# Patient Record
Sex: Male | Born: 1965
Health system: Southern US, Community
[De-identification: ages and names within clinical notes are randomized; demographics above are authoritative.]

---

## 2003-11-02 ENCOUNTER — Inpatient Hospital Stay (HOSPITAL_COMMUNITY): Admission: AD | Admit: 2003-11-02 | Discharge: 2003-11-05 | Payer: Self-pay | Admitting: Family Medicine

## 2003-11-05 ENCOUNTER — Encounter: Admission: RE | Admit: 2003-11-05 | Discharge: 2003-11-05 | Payer: Self-pay | Admitting: Family Medicine

## 2008-09-24 ENCOUNTER — Emergency Department (HOSPITAL_COMMUNITY): Admission: EM | Admit: 2008-09-24 | Discharge: 2008-09-24 | Payer: Self-pay | Admitting: Emergency Medicine

## 2010-02-08 IMAGING — CT CT HEAD W/O CM
4 of 6 series · 16 of 30 positions shown, 17 images · non-contrast
Comparison: None.

CT HEAD

CLINICAL DATA: Fall.  Neck pain.

CT HEAD WITHOUT CONTRAST
CT CERVICAL SPINE WITHOUT CONTRAST
TECHNIQUE: Multidetector CT imaging of the head and cervical spine
was performed following the standard protocol without intravenous
contrast.  Multiplanar CT image reconstructions of the cervical
spine were also generated.

[Series 2: brain · axial · 0.47mm/px · z∈[+382,+434]mm · 2 of 32 slices shown]
[im 11/32  brain]
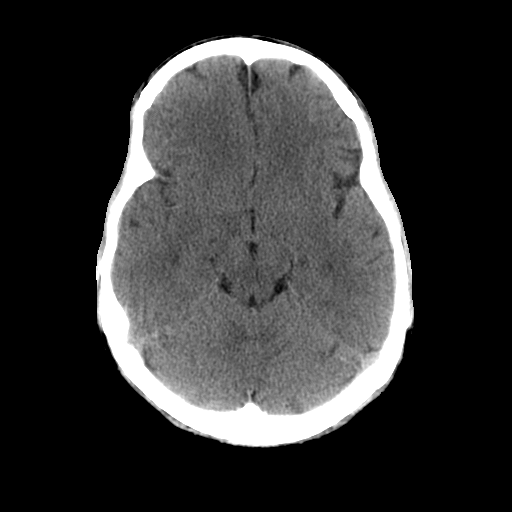
[im 21/32  brain]
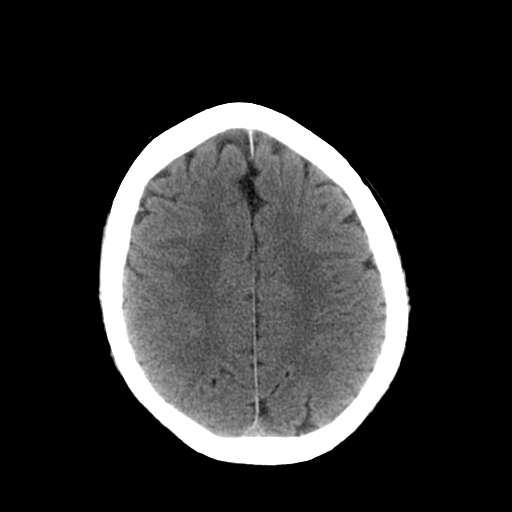

[Series 3: recon 2: brain · axial · 0.47mm/px · z∈[+359,+459]mm · 4 of 64 slices shown, 5 images]
[im 13/64  brain]
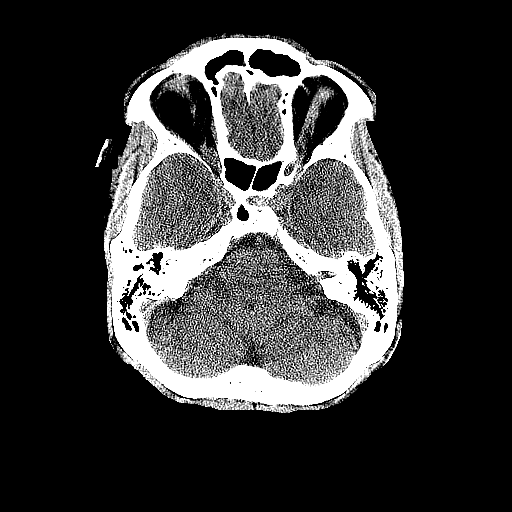
[im 13/64  bone]
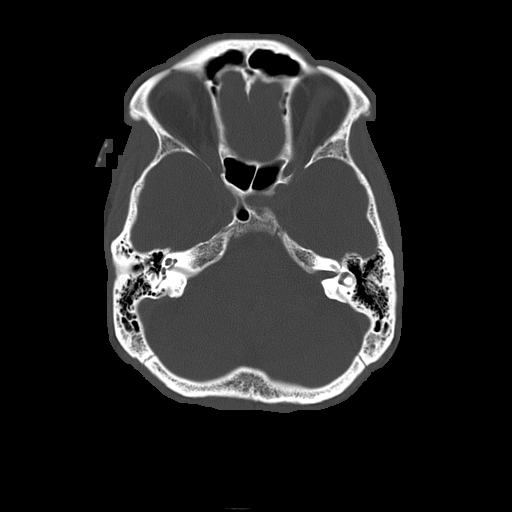
[im 26/64  brain]
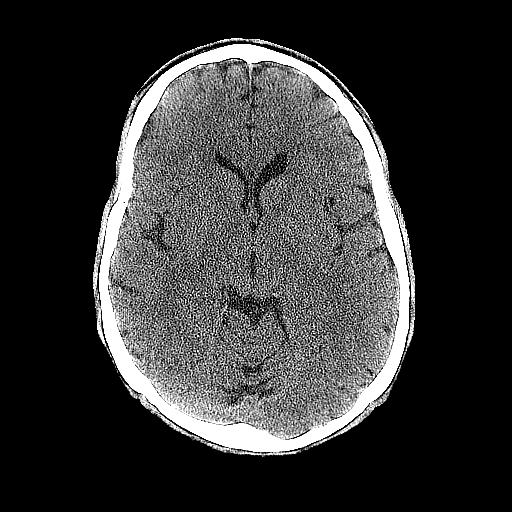
[im 38/64  brain]
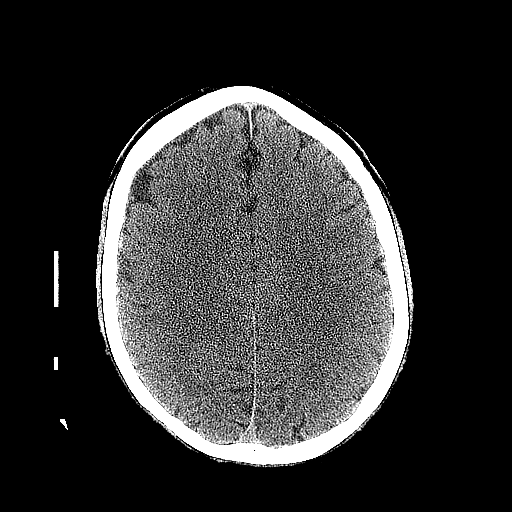
[im 51/64  brain]
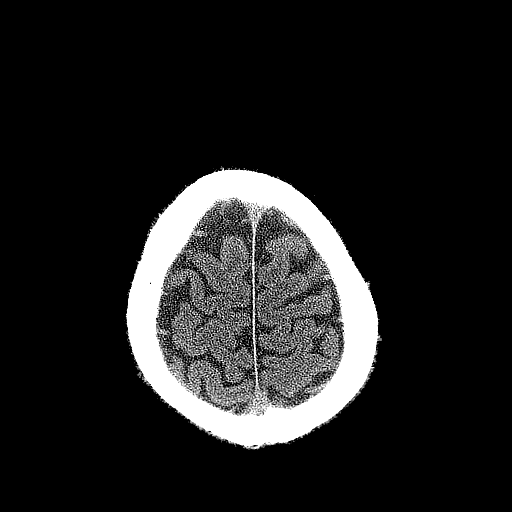

[Series 4: cervical spine · axial · 0.27mm/px · z∈[+146,+278]mm · 5 of 81 slices shown]
[im 14/81  brain]
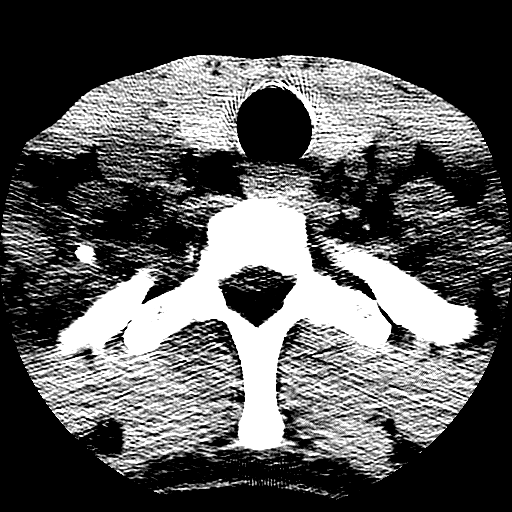
[im 27/81  brain]
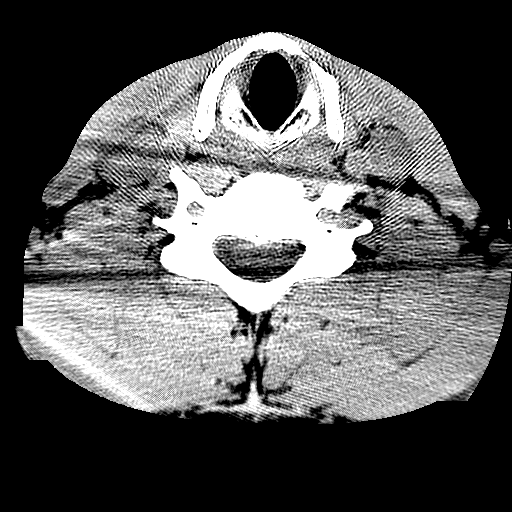
[im 41/81  brain]
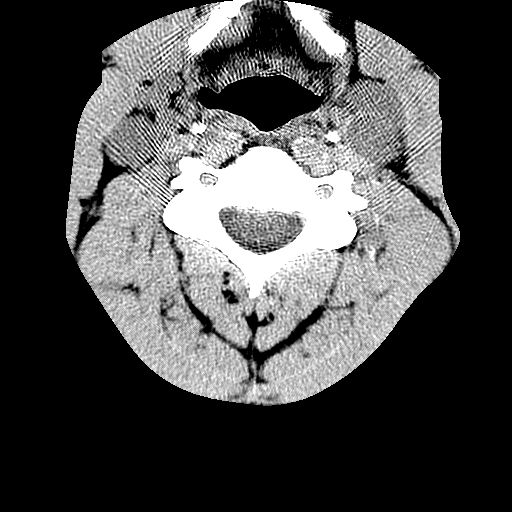
[im 54/81  brain]
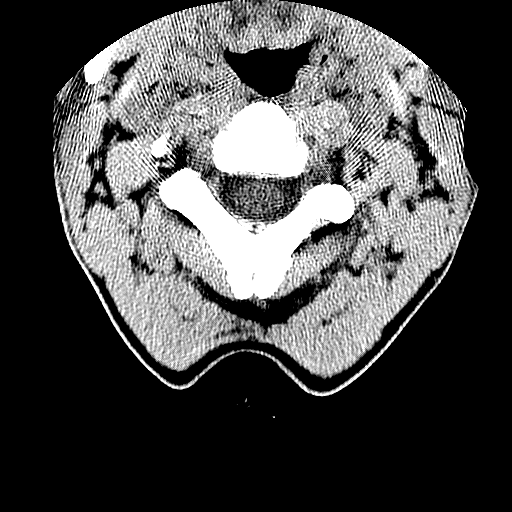
[im 67/81  brain]
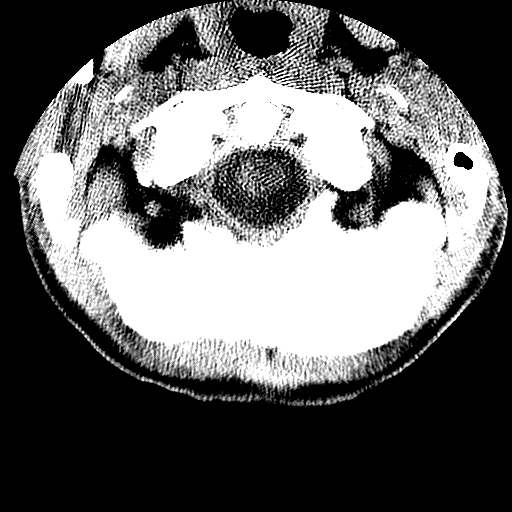

[Series 5: recon 2: cervical spine · axial · 0.27mm/px · z∈[+146,+278]mm · 5 of 81 slices shown]
[im 14/81  brain]
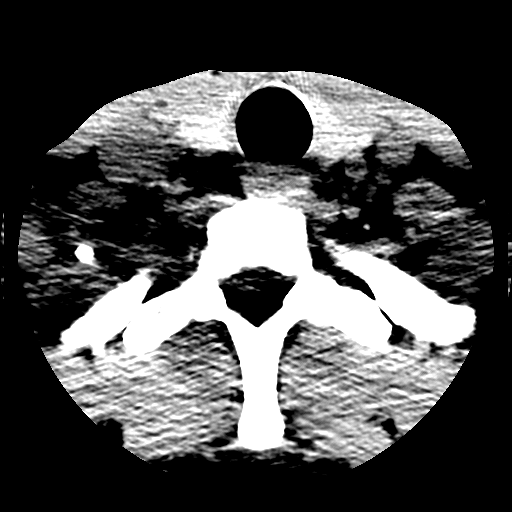
[im 27/81  brain]
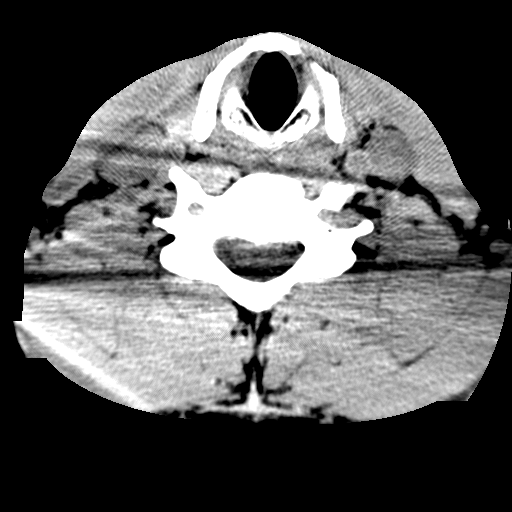
[im 41/81  brain]
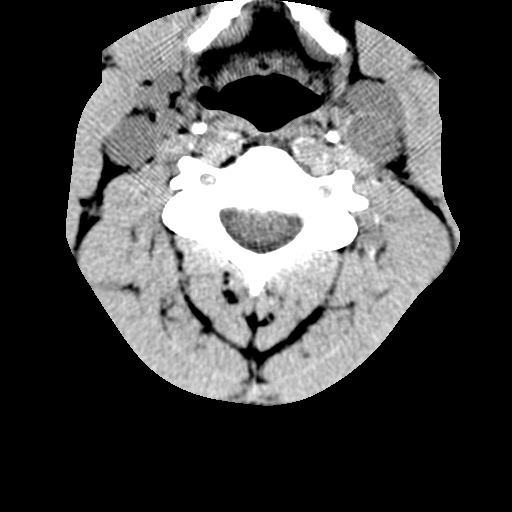
[im 54/81  brain]
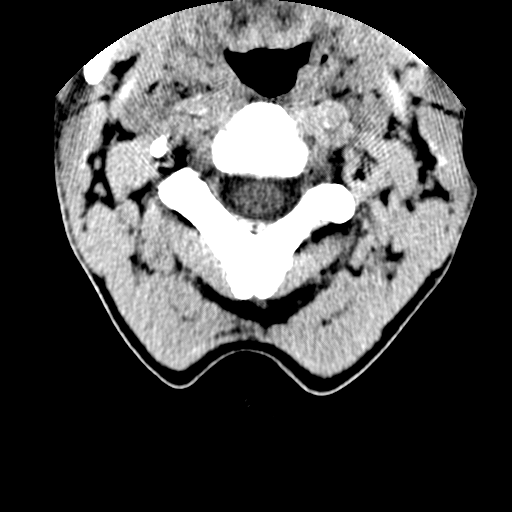
[im 67/81  brain]
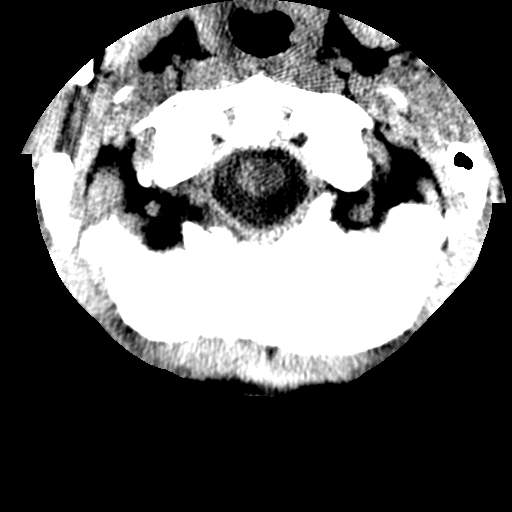

[16 of 30 positions shown; findings below may reference images not displayed]

FINDINGS: Ventricles are normal.  There is no intracranial
hemorrhage.  There is no infarct or mass lesion.  Negative for
skull fracture.
IMPRESSION: Negative.

CT CERVICAL SPINE
FINDINGS: Normal cervical alignment.  There is straightening of the
cervical lordosis.  There is mild disc degeneration and spurring at
C5-6 and C6-7.

Negative for fracture.
IMPRESSION: Negative for cervical spine fracture.

## 2010-10-08 LAB — POCT CARDIAC MARKERS
CKMB, poc: <1 ng/mL — ABNORMAL LOW (ref 1.0–8.0)
Myoglobin, poc: 23 ng/mL (ref 12–200)
Troponin i, poc: <0.05 ng/mL (ref 0.00–0.09)

## 2010-10-08 LAB — DIFFERENTIAL
Eosinophils Absolute: 0.3 10*3/uL (ref 0.0–0.7)
Eosinophils Relative: 4 % (ref 0–5)
Lymphocytes Relative: 15 % (ref 12–46)
Lymphs Abs: 1.2 10*3/uL (ref 0.7–4.0)
Monocytes Absolute: 0.5 10*3/uL (ref 0.1–1.0)
Monocytes Relative: 7 % (ref 3–12)

## 2010-10-08 LAB — POCT I-STAT, CHEM 8
BUN: 10 mg/dL (ref 6–23)
Chloride: 107 mEq/L (ref 96–112)
Glucose, Bld: 90 mg/dL (ref 70–99)
Potassium: 4.2 mEq/L (ref 3.5–5.1)
Sodium: 139 mEq/L (ref 135–145)

## 2010-10-08 LAB — CBC
Hemoglobin: 14.5 g/dL (ref 13.0–17.0)
MCHC: 34 g/dL (ref 30.0–36.0)
RBC: 4.9 MIL/uL (ref 4.22–5.81)
RDW: 14.4 % (ref 11.5–15.5)

## 2010-10-08 LAB — GLUCOSE, CAPILLARY: Glucose-Capillary: 84 mg/dL (ref 70–99)

## 2010-11-13 NOTE — H&P (Signed)
Dustin Lewis, Dustin Lewis NO.:  [id number]   MEDICAL RECORD NO.:  [id number]                   PATIENT TYPE:  INP   LOCATION:  5708                                 FACILITY:  [facility]   PHYSICIAN:  Franklyn Lor, MD                      DATE OF BIRTH:  03-07-1966   DATE OF ADMISSION:  11/02/2003  DATE OF DISCHARGE:                                HISTORY & PHYSICAL   ATTENDING PHYSICIAN:  Dr. Asencion Partridge of the family practice teaching  service.   CHIEF COMPLAINT:  Left leg cellulitis.   HISTORY OF PRESENT ILLNESS:  A 45 year old African American male of [facility] sent for direct admission by Dr. Cleta Alberts. The patient presented  to the [facility] the day prior to admission complaining of a one  to two day history of left lower extremity pain and swelling associated with  some redness. He had a fever at that time, white count of approximately 16.  He was diagnosed with cellulitis and given a shot of IM Rocephin and sent  home with p.o. Keflex. He returned to the [facility] today  feeling worse despite his fever having defervesced and his white count  having decreased to 13.7. The patient's compliance was an issue, according  to Dr. Cleta Alberts, and therefore he felt admission for IV antibiotics would be  appropriate.  The patient denies nausea, vomiting, or diarrhea, but admits  to some fever and chills on the days prior to admission. No known trauma or  animal or insect bites to the infected area.   REVIEW OF SYSTEMS:  The patient admits to unprotected sex.  Denies dysuria.  The patient admits to an episode of dizziness the day prior to admission in  which he was seated and began to feel light-headed, stood to walk across the  room, and the next thing I knew I was on the floor trying to get up.  Observers say he did not strike his head and that was unconscious for  approximately five seconds.  He reports that he has had several  episodes  like this over the years.   PAST MEDICAL HISTORY:  1. Chicken pox approximately two years ago requiring ICU stay.  2. Left mandible reconstruction with hardware some years ago following     facial trauma.   FAMILY HISTORY:  Father deceased, murdered; brother deceased, murdered;  mother deceased, unknown cause--patient states she has hypertension and was  on dialysis at the time of her passing.   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  Four to six beers per week. No IV drug use.   PHYSICAL EXAMINATION:  VITAL SIGNS: Temperature 98.4, pulse 96, respirations  137/81.  GENERAL: This is a thin, well-nourished [place] American male  in no apparent  distress. He is alert and oriented times three, interactive.  HEENT: Positive scleral icterus bilaterally which is the chronic and/or  baseline for this patient. PERRL; EOMI; MMM; oropharynx clear; nares clear;  no thyromegaly; no lymphadenopathy; no JVD; no carotid bruits.  CHEST: Regular rate and rhythm with no murmurs, rubs, or gallops. Lungs  clear to auscultation bilaterally with good respiratory effort. No CVA  tenderness.  ABDOMEN: Soft, nontender, nondistended; positive bowel sounds; no  hepatosplenomegaly; one soft nontender 1-cm mobile lymph node in the left  inguinal chain.  EXTREMITIES: 2+ radial and pedal pulses. Positive tenderness to palpation in  the left femoral vein. No palpable cords. Positive swelling of the left  lower extremity from the mid tibia distally, with patchy angular blanching  erythematous lesions superimposed on the swelling over the mid tibia. This  area is tender to palpation and warm to touch, but she has no full legs.  MUSCULOSKELETAL: The patient has 5/5 strength throughout, full range of  motion of his left lower extremity at all major joints. He does have an  antalgic gait secondary  to his pain in his left lower extremity.   LABORATORY DATA:  From an outside office white count 13.7 with an absolute  neutrophil count of 7.4, hemoglobin 13.5, platelet count 236,000.   ASSESSMENT/PLAN:  This is a 45 year old African American male with  cellulitis.  1. Cellulitis, largely clinical diagnosis and most likely in this situation     given the history and physical. Most likely the causative organisms.  The     patient is not immunocompromised that we know of, but the patient with a     history of pox as an adult requiring ICU stay, will off HIV test. IV     Ancef will cover the common causative of organisms including staph and     most likely strep. Differential diagnosis includes thrombophlebitis, but     no obvious streaking and no palpable cord on exam.  Plan is to mark the     current margin and follow clinically.  2. Pain. The patient is not in a great deal of pain at the time of exam, but     will provide Vicodin p.r.n..  3. Abnormal urinalysis. The day prior to admission the patient had urine     output greater than 100 of protein, moderate bilirubin at [facility].     Will check complete metabolic panel, repeat UA, and check urine drug     screen.  4. Syncope. The patient's episodes are sound vasovagal. He is on no     medications.  He is not volume depleted that we know of, but will give     him a 500 cc bolus of normal saline and check orthostatics and CBC. Also     check EKG to rule out occult cardiac source of syncope.                                                Franklyn Lor, MD    TD/MEDQ  D:  11/02/2003  T:  11/04/2003  Job:  [id number]

## 2010-11-13 NOTE — Discharge Summary (Signed)
Dustin Lewis, PUTT NO.:  [id number]   MEDICAL RECORD NO.:  [id number]                   PATIENT TYPE:  INP   LOCATION:  5708                                 FACILITY:  [facility]   PHYSICIAN:  Asencion Partridge, M.D.                  DATE OF BIRTH:  [birth year]   DATE OF ADMISSION:  11/02/2003  DATE OF DISCHARGE:  11/05/2003                                 DISCHARGE SUMMARY   DISCHARGE DIAGNOSES:  Cellulitis, left lower leg.   DISCHARGE MEDICATIONS:  1. Keflex 500 mg q.6h. x10 days.  2. Vicodin 500/5, 1-2 p.o. q.4-6h. p.r.n. pain.   HISTORY OF PRESENTING ILLNESS:  This is a 45 year old African-American male  patient of [facility] [place] who was admitted for cellulitis of  the left lower extremity that was recalcitrant to p.o. antibiotics as an  outpatient.  He was seen 2 days prior to admission, at which time he had a  fever and a white count of 16, diagnosed with cellulitis, and given a shot  of IM Rocephin.  He was sent home on p.o. Keflex.  Returned to [facility] the next day feeling worse, despite the fact that his fever had  defervesced, and his white count had decreased to 13.7.  According to Dr.  Cleta Alberts at [facility], the patient's compliance was a large issue, and, as  such, he was sent for inpatient IV antibiotics.  He denied nausea, vomiting,  or diarrhea at that time, but did have fever and chills.  He also had a  syncopal episode the day prior to admission that was associated with  shortness of air.   ADMISSION PHYSICAL AND LABORATORY DATA:  VITAL SIGNS:  Temperature 98.4,  pulse 96, respirations 20, blood pressure 137/81.  GENERAL:  This is a well-nourished African-American male in no apparent  distress, alert and oriented x3.  HEENT:  Positive scleral icterus bilaterally, which appears to be chronic.  HEART:  Regular rate and rhythm with no murmurs, rubs, or gallops.  LUNGS:  Clear.  BACK:  No CVA tenderness.  ABDOMEN:   Benign.  The patient did have an approximately 1 cm mobile,  nontender lymph node in the left inguinal chain.  EXTREMITIES:  The patient had positive tenderness to palpation in the left  femoral vein.  Also had positive swelling of the left lower leg from the mid-  tibial distally, with patchy irregular erythematous lesions that blanched,  superimposed on the swelling.  Warm to touch in this area.  The patient did  have 2+ pulses distally, and 5/5 strength throughout.  He did demonstrate an  antalgic gait secondary to pain in his left lower extremity.   Lab data at that time from the outside office showed a white count of 13.7,  with an absolute neutrophil count of 7.4, hemoglobin 13.5, platelet count  236,000.   The patient was admitted  to the family practice teaching service, and  started on IV Ancef q.8h., and given Vicodin p.o. q.4h. p.r.n. for pain.  The patient's erythema gradually improved during his stay here.  On hospital  day #2, the patient continued to complain of severe left lower extremity  pain, and at that time gave additional history stating that over the last 3  months he had had intermittent swelling, pain, and erythema of that same  extremity.  Combined with a history of syncopal episode associated with  shortness of breath the day prior to this admission, there was high  suspicion for deep vein thrombosis and possible pulmonary embolism.  To that  end, the patient had lower extremity venous Dopplers which showed no  evidence of acute DVT, superficial thrombosis, or Baker's cyst in either  leg.  It did show in the left leg vascularized lymph node in the inguinal  region, and an irregular anechoic structure at the lateral distal calf  measuring approximately 0.6 x 0.3 x 1 cm.  This area corresponded clinically  to the area of most acute tenderness.  In the right lower extremity, the  patient's Doppler was significant for thickened walls noted in the great  saphenous  vein, suggesting chronic clot, and several lymph nodes, the  largest of which was 2 cm x 1 cm.  I discussed at length these findings with  both the ultrasound technician and the radiologist, and the decision was  made to pursue an MRI to further address this anechoic irregularity in the  left later calf.  The patient was agreeable to this treatment plan, but,  because of the radiology department being very busy, his MRI was pushed back  to hospital day #3 early a.m.  On hospital day #3, morning rounds, the  patient expressed his dissatisfaction with having to stay, and was insistent  on leaving.  As such, results of the MRI were pending at the time of this  dictation summary and at the time of the patient's discharge.  Clinically,  his erythema and swelling had almost completely resolved, but he had  persistent exquisite tenderness in the left lateral calf in the area of this  supposed irregularity.  The patient stated that he would like [place] to call him  if the results were at all abnormal.  He was scheduled for an appointment  with Dr. Cleta Alberts a week from today, on Nov 12, 2003, to ensure resolution of  this left lower extremity pain.  He was sent home with p.o. Keflex and  Vicodin, as stated above in discharge medications.   ADDITIONAL STUDIES:  At the time of admission, the patient's medical history  was poorly defined, based on his reluctance to enter the medical system.  He  did admit to an ICU stay secondary to chicken pox approximately 2 years  prior to the his admission.  Because of chicken pox being unusual in an  adult, and the patient having no obvious source for cellulitis of an  extremity, we were concerned for some form of immunosuppression.  To that  end, the patient consented for HIV antibody test, which was nonreactive.  His ESR was 20, which was within normal limits.  He did have eosinophilia on his complete blood count on hospital day #1; this resolved on hospital day  #2.   The patient also had had a hepatitis panel drawn.  Hepatitis B surface  antigen was negative.  Hepatitis B surface antibody was negative.  Hepatitis  C results  were pending at the time of discharge.   ADDITIONAL LABORATORY DATA:  Sodium 140, potassium 3.8, chloride 107, bicarb  28, BUN 4, creatinine 0.7, glucose 97, total bilirubin 1.1, with an indirect  of 0.2, and direct of 0.9, alkaline phosphatase of 284, AST 47, ALT 51,  total protein 6.2, albumin 3.  PT 12.7, INR 0.9, PTT 45.  Urinalysis was  negative.   Increased LFT's could be attributed to the patient's alcohol ingestion  immediately prior to admission.  Otherwise, it may be worth following with a  repeat complete metabolic panel at his follow up visit.   With regard to his syncopal episode prior to admission, based on history, it  sounded consistent with orthostasis.  Orthostatic blood pressures the  morning following admission were 114/160 with a heart rate of 87 while lying  down, 132/72 with a heart rate of 89 while sitting, and 122/73 with a heart  rate of 89 while standing.   DISPOSITION:  In the end, we were unable to attribute the patient's  cellulitis to any known etiology.  He did appear to respond to the  antibiotics quite well, and hopefully his situation will resolve with the  continued use of p.o.  antibiotics as an outpatient.  In addition, we were unable to come up with  any known risk factors for potential immunosuppression.  According to Dr.  Cleta Alberts, this patient has a history of poor compliance, and, as such, we may  lose him to follow up.      Franklyn Lor, MD                            Asencion Partridge, M.D.    TD/MEDQ  D:  11/05/2003  T:  11/06/2003  Job:  [id number]   cc:   Brett Canales A. Cleta Alberts, M.D.  [address]  [place]  [place] [address]  Fax: [phone]

## 2011-04-07 ENCOUNTER — Emergency Department (HOSPITAL_COMMUNITY)
Admission: EM | Admit: 2011-04-07 | Discharge: 2011-04-07 | Disposition: A | Discharge status: Home / Self Care | Payer: Self-pay | Attending: Emergency Medicine | Admitting: Emergency Medicine

## 2011-04-07 DIAGNOSIS — K047 Periapical abscess without sinus: Secondary | ICD-10-CM | POA: Insufficient documentation

## 2011-04-07 DIAGNOSIS — K029 Dental caries, unspecified: Secondary | ICD-10-CM | POA: Insufficient documentation

## 2013-09-03 ENCOUNTER — Encounter (HOSPITAL_COMMUNITY): Payer: Self-pay | Admitting: Emergency Medicine

## 2013-09-03 ENCOUNTER — Emergency Department (HOSPITAL_COMMUNITY)
Admission: EM | Admit: 2013-09-03 | Discharge: 2013-09-03 | Disposition: A | Discharge status: Home / Self Care | Payer: 59 | Attending: Emergency Medicine | Admitting: Emergency Medicine

## 2013-09-03 ENCOUNTER — Emergency Department (HOSPITAL_COMMUNITY): Payer: 59

## 2013-09-03 DIAGNOSIS — F17200 Nicotine dependence, unspecified, uncomplicated: Secondary | ICD-10-CM | POA: Insufficient documentation

## 2013-09-03 DIAGNOSIS — N453 Epididymo-orchitis: Secondary | ICD-10-CM | POA: Insufficient documentation

## 2013-09-03 DIAGNOSIS — N451 Epididymitis: Secondary | ICD-10-CM

## 2013-09-03 LAB — CBC WITH DIFFERENTIAL/PLATELET
BASOS PCT: 0 % (ref 0–1)
Basophils Absolute: 0 10*3/uL (ref 0.0–0.1)
Eosinophils Absolute: 0.2 10*3/uL (ref 0.0–0.7)
Eosinophils Relative: 1 % (ref 0–5)
HEMATOCRIT: 40.5 % (ref 39.0–52.0)
HEMOGLOBIN: 14.2 g/dL (ref 13.0–17.0)
Lymphocytes Relative: 9 % — ABNORMAL LOW (ref 12–46)
Lymphs Abs: 1.9 10*3/uL (ref 0.7–4.0)
MCH: 29.5 pg (ref 26.0–34.0)
MCHC: 35.1 g/dL (ref 30.0–36.0)
MCV: 84.2 fL (ref 78.0–100.0)
Monocytes Absolute: 1.2 10*3/uL — ABNORMAL HIGH (ref 0.1–1.0)
Monocytes Relative: 6 % (ref 3–12)
NEUTROS PCT: 84 % — AB (ref 43–77)
Neutro Abs: 17 10*3/uL — ABNORMAL HIGH (ref 1.7–7.7)
Platelets: 219 10*3/uL (ref 150–400)
RBC: 4.81 MIL/uL (ref 4.22–5.81)
RDW: 14 % (ref 11.5–15.5)
WBC: 20.3 10*3/uL — AB (ref 4.0–10.5)

## 2013-09-03 LAB — URINALYSIS, ROUTINE W REFLEX MICROSCOPIC
Glucose, UA: NEGATIVE mg/dL
Hgb urine dipstick: NEGATIVE
KETONES UR: NEGATIVE mg/dL
Nitrite: NEGATIVE
PH: 5.5 (ref 5.0–8.0)
Protein, ur: NEGATIVE mg/dL
SPECIFIC GRAVITY, URINE: 1.027 (ref 1.005–1.030)
Urobilinogen, UA: 0.2 mg/dL (ref 0.0–1.0)

## 2013-09-03 LAB — BASIC METABOLIC PANEL
BUN: 9 mg/dL (ref 6–23)
CHLORIDE: 102 meq/L (ref 96–112)
CO2: 22 meq/L (ref 19–32)
Calcium: 9 mg/dL (ref 8.4–10.5)
Creatinine, Ser: 0.72 mg/dL (ref 0.50–1.35)
GFR calc non Af Amer: >90 mL/min (ref >90)
Glucose, Bld: 99 mg/dL (ref 70–99)
POTASSIUM: 3.7 meq/L (ref 3.7–5.3)
Sodium: 139 mEq/L (ref 137–147)

## 2013-09-03 LAB — URINE MICROSCOPIC-ADD ON

## 2013-09-03 MED ORDER — NAPROXEN 500 MG PO TABS: 500.0000 mg | ORAL_TABLET | Freq: Two times a day (BID) | ORAL | Status: DC

## 2013-09-03 MED ORDER — SODIUM CHLORIDE 0.9 % IV SOLN
1000.0000 mL | Freq: Once | INTRAVENOUS | Status: AC
  Administered 2013-09-03: 1000 mL via INTRAVENOUS

## 2013-09-03 MED ORDER — DOXYCYCLINE HYCLATE 100 MG PO TABS
100.0000 mg | ORAL_TABLET | Freq: Once | ORAL | Status: AC
  Administered 2013-09-03: 100 mg via ORAL
  Filled 2013-09-03: qty 1

## 2013-09-03 MED ORDER — ONDANSETRON HCL 4 MG/2ML IJ SOLN
4.0000 mg | Freq: Once | INTRAMUSCULAR | Status: AC
  Administered 2013-09-03: 4 mg via INTRAVENOUS
  Filled 2013-09-03: qty 2

## 2013-09-03 MED ORDER — HYDROMORPHONE HCL PF 1 MG/ML IJ SOLN
1.0000 mg | INTRAMUSCULAR | Status: DC | PRN
  Administered 2013-09-03: 1 mg via INTRAVENOUS
  Filled 2013-09-03: qty 1

## 2013-09-03 MED ORDER — HYDROCODONE-ACETAMINOPHEN 5-325 MG PO TABS: 1.0000 | ORAL_TABLET | ORAL | Status: DC | PRN

## 2013-09-03 MED ORDER — SODIUM CHLORIDE 0.9 % IV SOLN
1000.0000 mL | INTRAVENOUS | Status: DC
  Administered 2013-09-03: 1000 mL via INTRAVENOUS

## 2013-09-03 MED ORDER — DOXYCYCLINE HYCLATE 100 MG PO CAPS: 100.0000 mg | ORAL_CAPSULE | Freq: Two times a day (BID) | ORAL | Status: DC

## 2013-09-03 MED ORDER — DEXTROSE 5 % IV SOLN
1.0000 g | INTRAVENOUS | Status: DC
  Administered 2013-09-03: 1 g via INTRAVENOUS
  Filled 2013-09-03: qty 10

## 2013-09-03 NOTE — ED Notes (Signed)
Pt sts right sided testicle pain x 2 days; pt sts pain into abd; pt denies burning with urination

## 2013-09-03 NOTE — Discharge Instructions (Signed)
Orchitis °Orchitis is an infection of the testicle of usually sudden onset (happens quickly). It may be viral or bacterial (caused by germs). Usually with this illness there is generalized malaise (not feeling well) and fever. There is also pain. There is usually tenderness and swelling of the scrotum and testicle. °DIAGNOSIS  °Your caregiver will perform an exam to make sure there is not another reason for the pain in your testicle. A rectal exam may be done to find out if the prostate is swollen and tender. Blood work may be done to see if your white blood cell count is elevated. This can help determine if an infection is viral or bacterial. A urinalysis can also determine what type of infection is present. Most bacterial infections can be treated with antibiotics (medications which kill germs). °LET YOUR CAREGIVER KNOW ABOUT: °· Allergies. °· Medications taken including herbs, eye drops, over the counter medications, and creams. °· Use of steroids (by mouth or creams). °· Previous problems with anesthetics or novocaine. °· Previous prostate infections. °· History of blood clots (thrombophlebitis). °· History of bleeding or blood problems. °· Previous surgery. °· Previous urinary tract infection. °· Other health problems. °HOME CARE INSTRUCTIONS  °· Apply cold packs to the scrotal area for twenty minutes, four times per day or as needed. °· A scrotal support may be helpful. Keep a small pillow or support under your testicles while lying or sitting down. °· Only take over-the-counter or prescription medicines for pain, discomfort, or fever as directed by your caregiver. °· Take all medications, including antibiotics, as directed. Take the antibiotics for the full prescribed length of time even if you are feeling better. °SEEK IMMEDIATE MEDICAL CARE IF:  °· Your redness, swelling, or pain in the testicle increases or is not getting better. °· You have a fever. °· You have pain not relieved with medicines. °· You  have any worsening of any symptoms (problems) that originally brought you in for medical care. °Document Released: 06/11/2000 Document Revised: 09/06/2011 Document Reviewed: 06/14/2005 °ExitCare® Patient Information ©2014 [employer]. ° °Epididymitis °Epididymitis is a swelling (inflammation) of the epididymis. The epididymis is a cord-like structure along the back part of the testicle. Epididymitis is usually, but not always, caused by infection. This is usually a sudden problem beginning with chills, fever and pain behind the scrotum and in the testicle. There may be swelling and redness of the testicle. °DIAGNOSIS  °Physical examination will reveal a tender, swollen epididymis. Sometimes, cultures are obtained from the urine or from prostate secretions to help find out if there is an infection or if the cause is a different problem. Sometimes, blood work is performed to see if your white blood cell count is elevated and if a germ (bacterial) or viral infection is present. Using this knowledge, an appropriate medicine which kills germs (antibiotic) can be chosen by your caregiver. A viral infection causing epididymitis will most often go away (resolve) without treatment. °HOME CARE INSTRUCTIONS  °· Hot sitz baths for 20 minutes, 4 times per day, may help relieve pain. °· Only take over-the-counter or prescription medicines for pain, discomfort or fever as directed by your caregiver. °· Take all medicines, including antibiotics, as directed. Take the antibiotics for the full prescribed length of time even if you are feeling better. °· It is very important to keep all follow-up appointments. °SEEK IMMEDIATE MEDICAL CARE IF:  °· You have a fever. °· You have pain not relieved with medicines. °· You have any worsening of   your problems. °· Your pain seems to come and go. °· You develop pain, redness, and swelling in the scrotum and surrounding areas. °MAKE SURE YOU:  °· Understand these instructions. °· Will watch  your condition. °· Will get help right away if you are not doing well or get worse. °Document Released: 06/11/2000 Document Revised: 09/06/2011 Document Reviewed: 05/01/2009 °ExitCare® Patient Information ©2014 [employer]. ° °

## 2013-09-03 NOTE — ED Notes (Signed)
Dr.Knapp at bedside  

## 2013-09-03 NOTE — ED Provider Notes (Signed)
CSN: [id number]     Arrival date & time 09/03/13  1036 History   First MD Initiated Contact with Patient 09/03/13 1124     Chief Complaint  Patient presents with  . Testicle Pain    Patient is a 48 y.o. male presenting with testicular pain. The history is provided by the patient.  Testicle Pain The current episode started 2 days ago. The problem occurs constantly. The problem has been gradually worsening. Associated symptoms include abdominal pain. Exacerbated by: movement and palpation. The symptoms are relieved by NSAIDs. Treatments tried: naids. The treatment provided mild relief.  No trouble with urination.  He thinks he saw some blood in the urine.  He feels like his testicle on that right side is swelling.  No vomiting, diarrhea.  No fever but he has felt chilled.  History reviewed. No pertinent past medical history. History reviewed. No pertinent past surgical history. History reviewed. No pertinent family history. History  Substance Use Topics  . Smoking status: Current Every Day Smoker  . Smokeless tobacco: Not on file  . Alcohol Use: Yes    Review of Systems  Gastrointestinal: Positive for abdominal pain.  Genitourinary: Positive for testicular pain.  All other systems reviewed and are negative.      Allergies  Review of patient's allergies indicates no known allergies.  Home Medications   Current Outpatient Rx  Name  Route  Sig  Dispense  Refill  . Aspirin-Salicylamide-Caffeine (BC HEADACHE PO)   Oral   Take 2-3 packets by mouth daily as needed (for pain).         Marland Kitchen doxycycline (VIBRAMYCIN) 100 MG capsule   Oral   Take 1 capsule (100 mg total) by mouth 2 (two) times daily.   28 capsule   0   . HYDROcodone-acetaminophen (NORCO/VICODIN) 5-325 MG per tablet   Oral   Take 1-2 tablets by mouth every 4 (four) hours as needed.   30 tablet   0   . naproxen (NAPROSYN) 500 MG tablet   Oral   Take 1 tablet (500 mg total) by mouth 2 (two) times daily.   30  tablet   0    BP 111/72  Pulse 82  Temp(Src) 98.9 F (37.2 C) (Oral)  Resp 16  SpO2 100% Physical Exam  Nursing note and vitals reviewed. Constitutional: He appears well-developed and well-nourished. No distress.  HENT:  Head: Normocephalic and atraumatic.  Right Ear: External ear normal.  Left Ear: External ear normal.  Eyes: Conjunctivae are normal. Right eye exhibits no discharge. Left eye exhibits no discharge. No scleral icterus.  Neck: Neck supple. No tracheal deviation present.  Cardiovascular: Normal rate, regular rhythm and intact distal pulses.   Pulmonary/Chest: Effort normal and breath sounds normal. No stridor. No respiratory distress. He has no wheezes. He has no rales.  Abdominal: Soft. Bowel sounds are normal. He exhibits no distension. There is no tenderness. There is no rebound and no guarding. Hernia confirmed negative in the right inguinal area and confirmed negative in the left inguinal area.  Genitourinary:    Right testis shows swelling and tenderness. Left testis shows no mass, no swelling and no tenderness.  Musculoskeletal: He exhibits no edema and no tenderness.  Neurological: He is alert. He has normal strength. No cranial nerve deficit (no facial droop, extraocular movements intact, no slurred speech) or sensory deficit. He exhibits normal muscle tone. He displays no seizure activity. Coordination normal.  Skin: Skin is warm and dry. No rash noted.  Psychiatric: He has a normal mood and affect.    ED Course  Procedures (including critical care time) Labs Review Labs Reviewed  CBC WITH DIFFERENTIAL - Abnormal; Notable for the following:    WBC 20.3 (*)    Neutrophils Relative % 84 (*)    Neutro Abs 17.0 (*)    Lymphocytes Relative 9 (*)    Monocytes Absolute 1.2 (*)    All other components within normal limits  URINALYSIS, ROUTINE W REFLEX MICROSCOPIC - Abnormal; Notable for the following:    Bilirubin Urine SMALL (*)    Leukocytes, UA TRACE  (*)    All other components within normal limits  URINE MICROSCOPIC-ADD ON - Abnormal; Notable for the following:    Squamous Epithelial / LPF FEW (*)    Bacteria, UA FEW (*)    All other components within normal limits  BASIC METABOLIC PANEL   Imaging Review [place] Scrotum  09/03/2013   CLINICAL DATA:  Two day history of right-sided testicular pain and scrotal swelling  EXAM: SCROTAL ULTRASOUND  DOPPLER ULTRASOUND OF THE TESTICLES  TECHNIQUE: Complete ultrasound examination of the testicles, epididymis, and other scrotal structures was performed. Color and spectral Doppler ultrasound were also utilized to evaluate blood flow to the testicles.  COMPARISON:  None.  FINDINGS: Right testicle  Measurements: 4.8 x 2.9 x 3.1 cm. The echotexture of the right testicle is normal. The right testicle is hyperemic. No mass or microlithiasis visualized.  Left testicle  Measurements: 3.7 x 1.6 x 2.1 cm. The echotexture of the left testicle is normal. Vascularity of the testicle appears normal. A tiny testicular cyst measuring 2 mm in diameter is demonstrated.  Right epididymis: The right testicle is hyperemic. It contains a 5 mm maximal dimension cyst.  Left epididymis: Normal in size and appearance. The vascularity is normal.  Hydrocele:  There is a small right-sided hydrocele.  Varicocele:  There is a small varicocele on the left.  IMPRESSION: 1. There is mild testicular enlargement. Both the right testicle and epididymis are hyperemic. There is a small right-sided hydrocele. These findings suggest right-sided epididymo-orchitis. 2. The left testicle and left epididymis are normal in appearance. There is a small varicocele on the left.   Electronically Signed   By: David  [place]   On: 09/03/2013 13:06   [place] Art/ven Flow Abd Pelv Doppler  09/03/2013   CLINICAL DATA:  Two day history of right-sided testicular pain and scrotal swelling  EXAM: SCROTAL ULTRASOUND  DOPPLER ULTRASOUND OF THE TESTICLES  TECHNIQUE: Complete  ultrasound examination of the testicles, epididymis, and other scrotal structures was performed. Color and spectral Doppler ultrasound were also utilized to evaluate blood flow to the testicles.  COMPARISON:  None.  FINDINGS: Right testicle  Measurements: 4.8 x 2.9 x 3.1 cm. The echotexture of the right testicle is normal. The right testicle is hyperemic. No mass or microlithiasis visualized.  Left testicle  Measurements: 3.7 x 1.6 x 2.1 cm. The echotexture of the left testicle is normal. Vascularity of the testicle appears normal. A tiny testicular cyst measuring 2 mm in diameter is demonstrated.  Right epididymis: The right testicle is hyperemic. It contains a 5 mm maximal dimension cyst.  Left epididymis: Normal in size and appearance. The vascularity is normal.  Hydrocele:  There is a small right-sided hydrocele.  Varicocele:  There is a small varicocele on the left.  IMPRESSION: 1. There is mild testicular enlargement. Both the right testicle and epididymis are hyperemic. There is a small right-sided hydrocele.  These findings suggest right-sided epididymo-orchitis. 2. The left testicle and left epididymis are normal in appearance. There is a small varicocele on the left.   Electronically Signed   By: David  [place]   On: 09/03/2013 13:06     MDM   Final diagnoses:  Epididymitis    Findings, history and exam are consistent with epididymitis.  Abx given in the ED.  Will dc home on docycyline and pain meds.  Follow up with urology or a PCP    Celene Kras, MD 09/03/13 1344

## 2013-09-03 NOTE — ED Notes (Signed)
Pt comfortable with discharge and follow up instructions. Prescriptions x3. 

## 2015-01-18 IMAGING — US US SCROTUM
1 series · 13 of 25 positions shown · non-contrast
Comparison: None.

CLINICAL DATA: Two day history of right-sided testicular pain and
scrotal swelling

EXAM:
SCROTAL ULTRASOUND
DOPPLER ULTRASOUND OF THE TESTICLES
TECHNIQUE: Complete ultrasound examination of the testicles, epididymis, and
other scrotal structures was performed. Color and spectral Doppler
ultrasound were also utilized to evaluate blood flow to the
testicles.

[Series 1: us scrotum · 0.06mm/px · 13 of 79 slices shown]
[im 1/79]
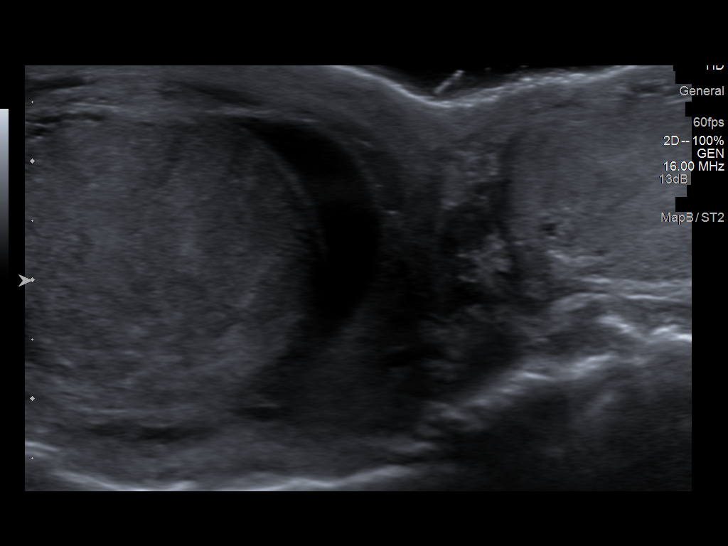
[im 7/79]
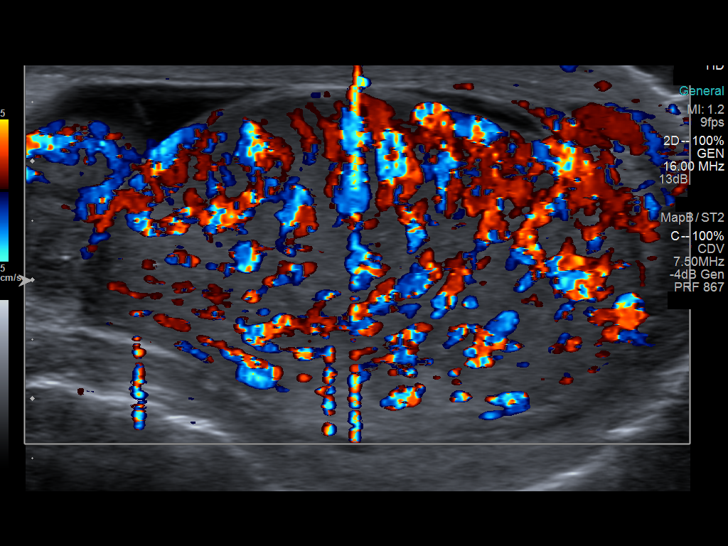
[im 14/79]
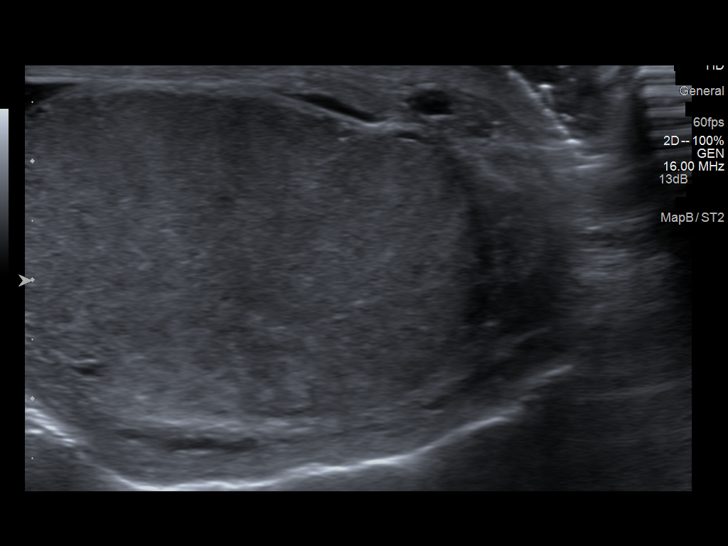
[im 20/79]
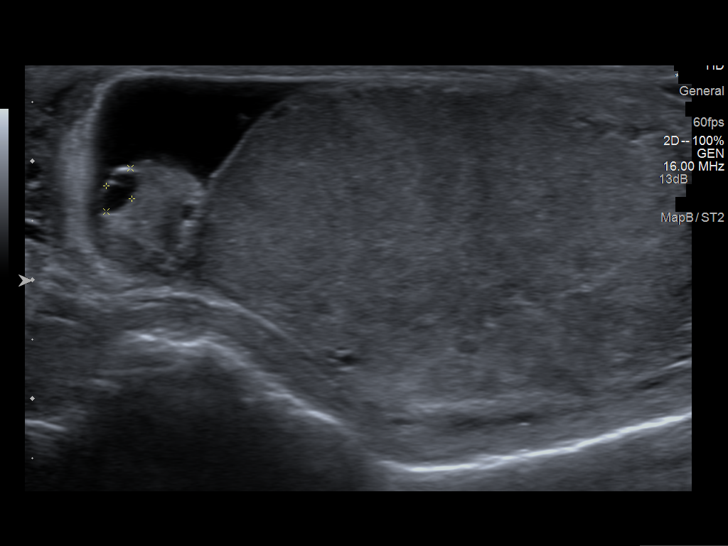
[im 27/79]
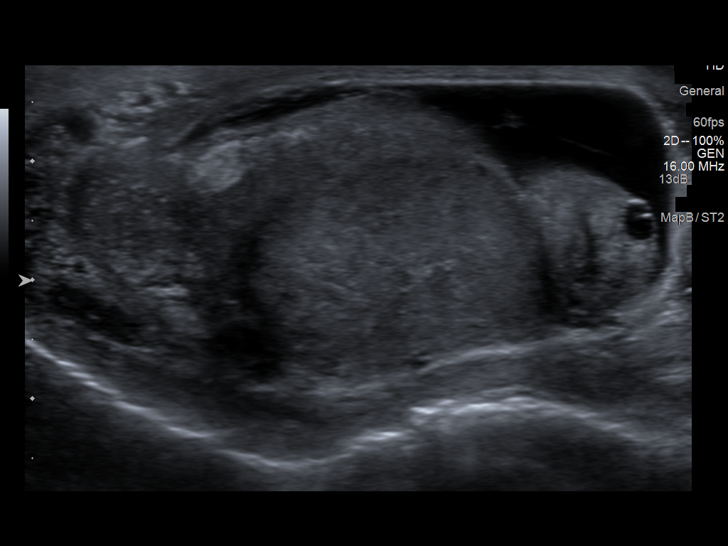
[im 33/79]
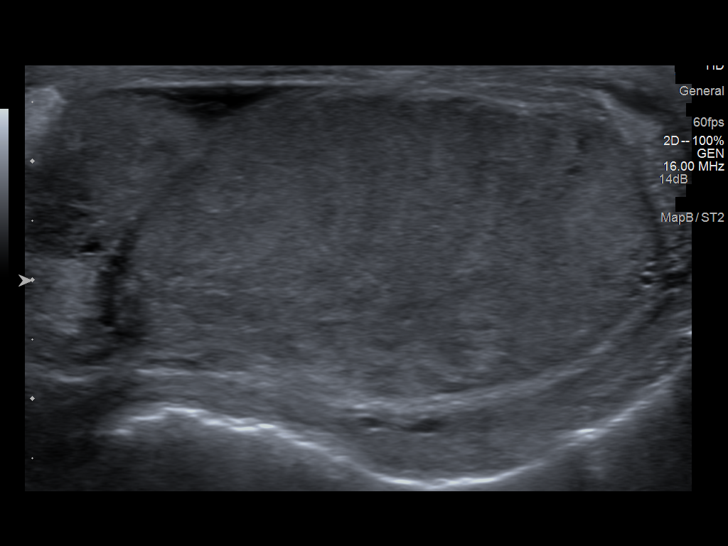
[im 40/79]
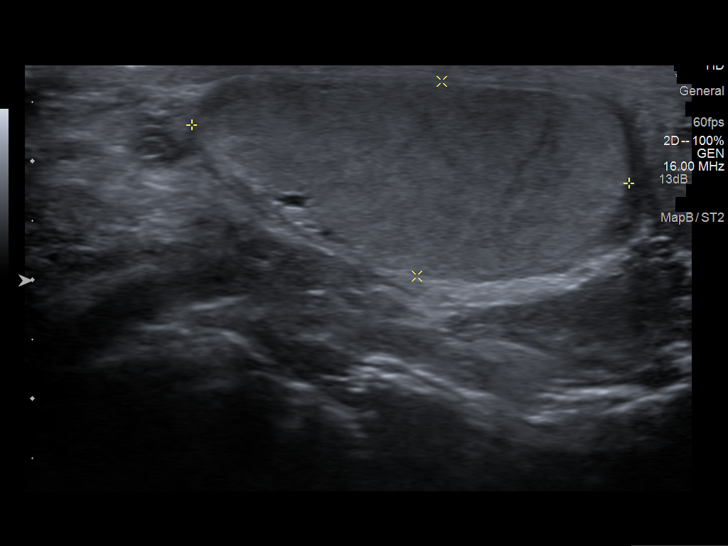
[im 46/79]
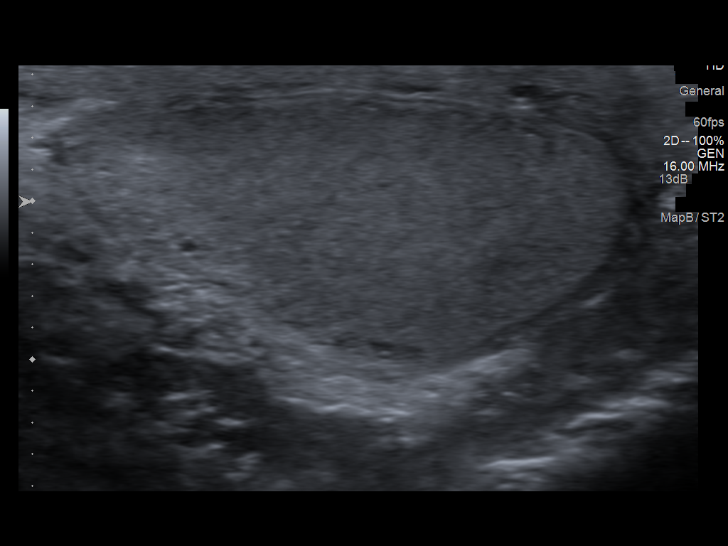
[im 53/79]
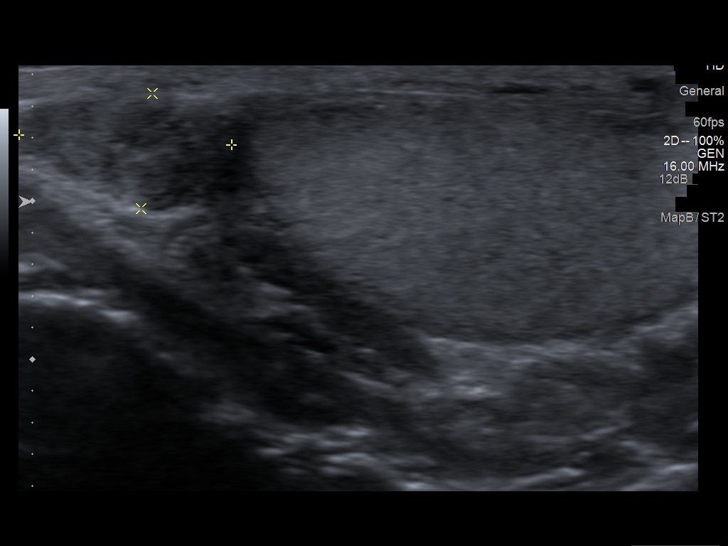
[im 59/79]
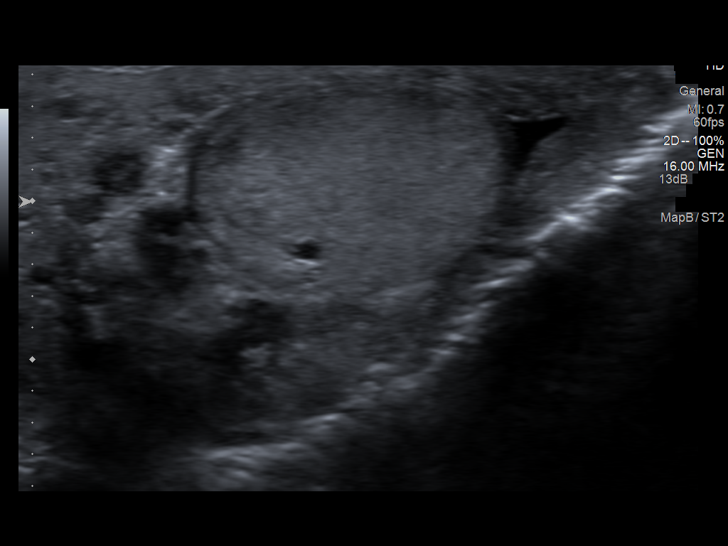
[im 66/79]
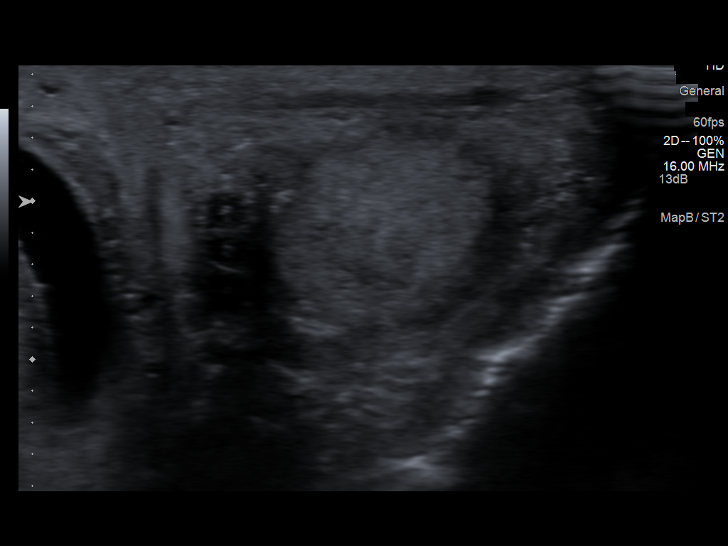
[im 72/79]
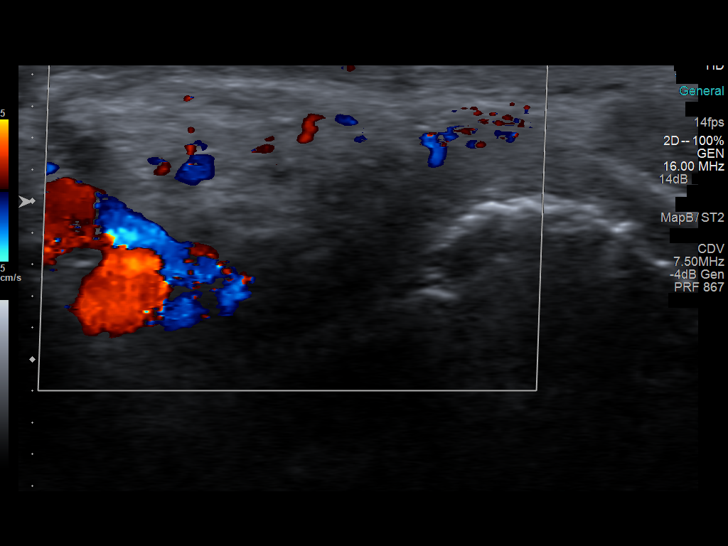
[im 79/79]
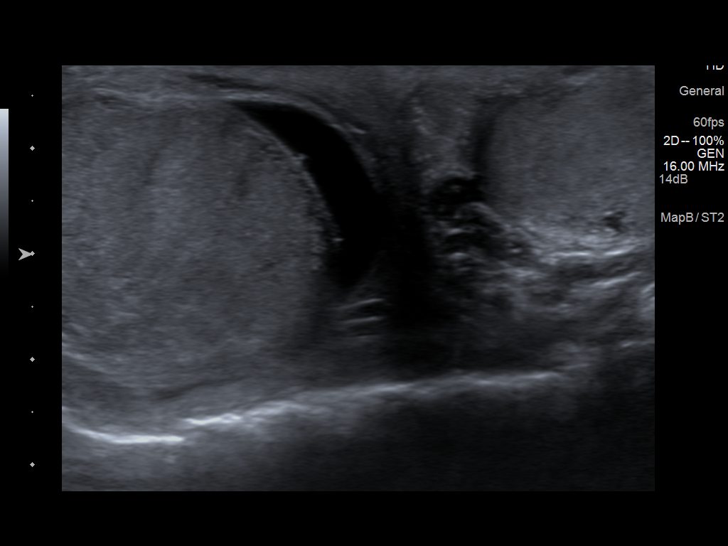

[13 of 25 positions shown; findings below may reference images not displayed]

FINDINGS: Right testicle

Measurements: 4.8 x 2.9 x 3.1 cm. The echotexture of the right
testicle is normal. The right testicle is hyperemic.. No mass or
microlithiasis visualized.

Left testicle

Measurements: 3.7 x 1.6 x 2.1 cm. The echotexture of the left
testicle is normal. Vascularity of the testicle appears normal. A
tiny testicular cyst measuring 2 mm in diameter is demonstrated..

Right epididymis: The right testicle is hyperemic. It contains a 5
mm maximal dimension cyst.

Left epididymis: Normal in size and appearance. The vascularity is
normal.

Hydrocele:  There is a small right-sided hydrocele.

Varicocele:  There is a small varicocele on the left.
IMPRESSION: 1. There is mild testicular enlargement. Both the right testicle and
epididymis are hyperemic. There is a small right-sided hydrocele.
These findings suggest right-sided epididymo-orchitis.
2. The left testicle and left epididymis are normal in appearance.
There is a small varicocele on the left.

## 2019-06-03 ENCOUNTER — Other Ambulatory Visit: Payer: Self-pay

## 2019-06-03 ENCOUNTER — Observation Stay (HOSPITAL_COMMUNITY)
Admission: EM | Admit: 2019-06-03 | Discharge: 2019-06-04 | Disposition: A | Discharge status: Home / Self Care | Payer: Self-pay | Attending: Student in an Organized Health Care Education/Training Program | Admitting: Student in an Organized Health Care Education/Training Program

## 2019-06-03 DIAGNOSIS — E8889 Other specified metabolic disorders: Secondary | ICD-10-CM

## 2019-06-03 DIAGNOSIS — E872 Acidosis: Secondary | ICD-10-CM | POA: Insufficient documentation

## 2019-06-03 DIAGNOSIS — X58XXXA Exposure to other specified factors, initial encounter: Secondary | ICD-10-CM | POA: Insufficient documentation

## 2019-06-03 DIAGNOSIS — F17200 Nicotine dependence, unspecified, uncomplicated: Secondary | ICD-10-CM | POA: Insufficient documentation

## 2019-06-03 DIAGNOSIS — T391X1A Poisoning by 4-Aminophenol derivatives, accidental (unintentional), initial encounter: Secondary | ICD-10-CM | POA: Insufficient documentation

## 2019-06-03 DIAGNOSIS — S025XXB Fracture of tooth (traumatic), initial encounter for open fracture: Secondary | ICD-10-CM | POA: Insufficient documentation

## 2019-06-03 DIAGNOSIS — Z791 Long term (current) use of non-steroidal anti-inflammatories (NSAID): Secondary | ICD-10-CM | POA: Insufficient documentation

## 2019-06-03 DIAGNOSIS — Z20828 Contact with and (suspected) exposure to other viral communicable diseases: Secondary | ICD-10-CM | POA: Insufficient documentation

## 2019-06-03 DIAGNOSIS — K029 Dental caries, unspecified: Secondary | ICD-10-CM | POA: Insufficient documentation

## 2019-06-03 DIAGNOSIS — T39091A Poisoning by salicylates, accidental (unintentional), initial encounter: Secondary | ICD-10-CM

## 2019-06-03 DIAGNOSIS — T39011A Poisoning by aspirin, accidental (unintentional), initial encounter: Principal | ICD-10-CM | POA: Insufficient documentation

## 2019-06-03 DIAGNOSIS — E8729 Other acidosis: Secondary | ICD-10-CM | POA: Diagnosis present

## 2019-06-03 DIAGNOSIS — Z7289 Other problems related to lifestyle: Secondary | ICD-10-CM

## 2019-06-03 DIAGNOSIS — Z792 Long term (current) use of antibiotics: Secondary | ICD-10-CM | POA: Insufficient documentation

## 2019-06-03 DIAGNOSIS — K0889 Other specified disorders of teeth and supporting structures: Secondary | ICD-10-CM

## 2019-06-03 LAB — COMPREHENSIVE METABOLIC PANEL
ALT: 24 U/L (ref 0–44)
ALT: 21 U/L (ref 0–44)
ALT: 21 U/L (ref 0–44)
ALT: 18 U/L (ref 0–44)
AST: 19 U/L (ref 15–41)
AST: 17 U/L (ref 15–41)
AST: 17 U/L (ref 15–41)
AST: 19 U/L (ref 15–41)
Albumin: 3.8 g/dL (ref 3.5–5.0)
Albumin: 3.2 g/dL — ABNORMAL LOW (ref 3.5–5.0)
Albumin: 3.7 g/dL (ref 3.5–5.0)
Albumin: 3.1 g/dL — ABNORMAL LOW (ref 3.5–5.0)
Alkaline Phosphatase: 54 U/L (ref 38–126)
Alkaline Phosphatase: 37 U/L — ABNORMAL LOW (ref 38–126)
Alkaline Phosphatase: 49 U/L (ref 38–126)
Alkaline Phosphatase: 40 U/L (ref 38–126)
Anion gap: 14 (ref 5–15)
Anion gap: 11 (ref 5–15)
Anion gap: 13 (ref 5–15)
Anion gap: 11 (ref 5–15)
BUN: 14 mg/dL (ref 6–20)
BUN: 11 mg/dL (ref 6–20)
BUN: 11 mg/dL (ref 6–20)
BUN: 8 mg/dL (ref 6–20)
CO2: 16 mmol/L — ABNORMAL LOW (ref 22–32)
CO2: 19 mmol/L — ABNORMAL LOW (ref 22–32)
CO2: 20 mmol/L — ABNORMAL LOW (ref 22–32)
CO2: 25 mmol/L (ref 22–32)
Calcium: 8.4 mg/dL — ABNORMAL LOW (ref 8.9–10.3)
Calcium: 8 mg/dL — ABNORMAL LOW (ref 8.9–10.3)
Calcium: 8.4 mg/dL — ABNORMAL LOW (ref 8.9–10.3)
Calcium: 8 mg/dL — ABNORMAL LOW (ref 8.9–10.3)
Chloride: 108 mmol/L (ref 98–111)
Chloride: 107 mmol/L (ref 98–111)
Chloride: 108 mmol/L (ref 98–111)
Chloride: 102 mmol/L (ref 98–111)
Creatinine, Ser: 1.14 mg/dL (ref 0.61–1.24)
Creatinine, Ser: 1.01 mg/dL (ref 0.61–1.24)
Creatinine, Ser: 1.16 mg/dL (ref 0.61–1.24)
Creatinine, Ser: 1.12 mg/dL (ref 0.61–1.24)
GFR calc Af Amer: >60 mL/min (ref >60)
GFR calc Af Amer: >60 mL/min (ref >60)
GFR calc Af Amer: >60 mL/min (ref >60)
GFR calc Af Amer: >60 mL/min (ref >60)
GFR calc non Af Amer: >60 mL/min (ref >60)
GFR calc non Af Amer: >60 mL/min (ref >60)
GFR calc non Af Amer: >60 mL/min (ref >60)
GFR calc non Af Amer: >60 mL/min (ref >60)
Glucose, Bld: 117 mg/dL — ABNORMAL HIGH (ref 70–99)
Glucose, Bld: 130 mg/dL — ABNORMAL HIGH (ref 70–99)
Glucose, Bld: 156 mg/dL — ABNORMAL HIGH (ref 70–99)
Glucose, Bld: 116 mg/dL — ABNORMAL HIGH (ref 70–99)
Potassium: 4 mmol/L (ref 3.5–5.1)
Potassium: 3.7 mmol/L (ref 3.5–5.1)
Potassium: 4.5 mmol/L (ref 3.5–5.1)
Potassium: 2.8 mmol/L — ABNORMAL LOW (ref 3.5–5.1)
Sodium: 138 mmol/L (ref 135–145)
Sodium: 138 mmol/L (ref 135–145)
Sodium: 140 mmol/L (ref 135–145)
Sodium: 138 mmol/L (ref 135–145)
Total Bilirubin: 0.5 mg/dL (ref 0.3–1.2)
Total Bilirubin: 0.1 mg/dL — ABNORMAL LOW (ref 0.3–1.2)
Total Bilirubin: 0.3 mg/dL (ref 0.3–1.2)
Total Bilirubin: 0.6 mg/dL (ref 0.3–1.2)
Total Protein: 6.8 g/dL (ref 6.5–8.1)
Total Protein: 5.6 g/dL — ABNORMAL LOW (ref 6.5–8.1)
Total Protein: 6.4 g/dL — ABNORMAL LOW (ref 6.5–8.1)
Total Protein: 5.6 g/dL — ABNORMAL LOW (ref 6.5–8.1)

## 2019-06-03 LAB — CBC WITH DIFFERENTIAL/PLATELET
Abs Immature Granulocytes: 0.1 K/uL — ABNORMAL HIGH (ref 0.00–0.07)
Basophils Absolute: 0 K/uL (ref 0.0–0.1)
Basophils Relative: 0 %
Eosinophils Absolute: 0 K/uL (ref 0.0–0.5)
Eosinophils Relative: 0 %
HCT: 47 % (ref 39.0–52.0)
Hemoglobin: 15.7 g/dL (ref 13.0–17.0)
Immature Granulocytes: 1 %
Lymphocytes Relative: 10 %
Lymphs Abs: 1.3 K/uL (ref 0.7–4.0)
MCH: 28.9 pg (ref 26.0–34.0)
MCHC: 33.4 g/dL (ref 30.0–36.0)
MCV: 86.6 fL (ref 80.0–100.0)
Monocytes Absolute: 0.6 K/uL (ref 0.1–1.0)
Monocytes Relative: 5 %
Neutro Abs: 10.6 K/uL — ABNORMAL HIGH (ref 1.7–7.7)
Neutrophils Relative %: 84 %
Platelets: 259 K/uL (ref 150–400)
RBC: 5.43 MIL/uL (ref 4.22–5.81)
RDW: 14.6 % (ref 11.5–15.5)
WBC: 12.6 K/uL — ABNORMAL HIGH (ref 4.0–10.5)
nRBC: 0 % (ref 0.0–0.2)

## 2019-06-03 LAB — PROTIME-INR
INR: 1.1 (ref 0.8–1.2)
Prothrombin Time: 14.3 s (ref 11.4–15.2)

## 2019-06-03 LAB — CBC
HCT: 41.1 % (ref 39.0–52.0)
Hemoglobin: 14 g/dL (ref 13.0–17.0)
MCH: 29 pg (ref 26.0–34.0)
MCHC: 34.1 g/dL (ref 30.0–36.0)
MCV: 85.3 fL (ref 80.0–100.0)
Platelets: 242 10*3/uL (ref 150–400)
RBC: 4.82 MIL/uL (ref 4.22–5.81)
RDW: 14.3 % (ref 11.5–15.5)
WBC: 11.3 10*3/uL — ABNORMAL HIGH (ref 4.0–10.5)
nRBC: 0 % (ref 0.0–0.2)

## 2019-06-03 LAB — BLOOD GAS, VENOUS
Acid-Base Excess: 4.9 mmol/L — ABNORMAL HIGH (ref 0.0–2.0)
Bicarbonate: 27.8 mmol/L (ref 20.0–28.0)
Drawn by: 21072
FIO2: 21
O2 Saturation: 99.2 %
Patient temperature: 36.6
pCO2, Ven: 33.1 mmHg — ABNORMAL LOW (ref 44.0–60.0)
pH, Ven: 7.532 — ABNORMAL HIGH (ref 7.250–7.430)
pO2, Ven: 151 mmHg — ABNORMAL HIGH (ref 32.0–45.0)

## 2019-06-03 LAB — ACETAMINOPHEN LEVEL
Acetaminophen (Tylenol), Serum: 45 ug/mL — ABNORMAL HIGH (ref 10–30)
Acetaminophen (Tylenol), Serum: 14 ug/mL (ref 10–30)

## 2019-06-03 LAB — PHOSPHORUS: Phosphorus: 2.5 mg/dL (ref 2.5–4.6)

## 2019-06-03 LAB — SALICYLATE LEVEL
Salicylate Lvl: 51.1 mg/dL (ref 2.8–30.0)
Salicylate Lvl: 46.3 mg/dL (ref 2.8–30.0)
Salicylate Lvl: 29.6 mg/dL (ref 2.8–30.0)

## 2019-06-03 LAB — MAGNESIUM
Magnesium: 1.8 mg/dL (ref 1.7–2.4)
Magnesium: 1.8 mg/dL (ref 1.7–2.4)

## 2019-06-03 LAB — HIV ANTIBODY (ROUTINE TESTING W REFLEX): HIV Screen 4th Generation wRfx: NONREACTIVE

## 2019-06-03 LAB — POC SARS CORONAVIRUS 2 AG -  ED: SARS Coronavirus 2 Ag: NEGATIVE

## 2019-06-03 MED ORDER — SODIUM CHLORIDE 0.9 % IV BOLUS
500.0000 mL | Freq: Once | INTRAVENOUS | Status: AC
  Administered 2019-06-03: 500 mL via INTRAVENOUS

## 2019-06-03 MED ORDER — ONDANSETRON HCL 4 MG/2ML IJ SOLN
4.0000 mg | Freq: Once | INTRAMUSCULAR | Status: AC
  Administered 2019-06-03: 4 mg via INTRAVENOUS
  Filled 2019-06-03: qty 2

## 2019-06-03 MED ORDER — LACTATED RINGERS IV BOLUS
20.0000 mL/kg | Freq: Once | INTRAVENOUS | Status: AC
  Administered 2019-06-03: 1496 mL via INTRAVENOUS

## 2019-06-03 MED ORDER — LORAZEPAM 2 MG/ML IJ SOLN: 0.5000 mg | INTRAMUSCULAR | Status: DC | PRN

## 2019-06-03 MED ORDER — POTASSIUM CHLORIDE 10 MEQ/100ML IV SOLN
10.0000 meq | INTRAVENOUS | Status: AC
  Administered 2019-06-03 (×2): 10 meq via INTRAVENOUS
  Filled 2019-06-03 (×2): qty 100

## 2019-06-03 MED ORDER — NICOTINE 21 MG/24HR TD PT24
21.0000 mg | MEDICATED_PATCH | Freq: Every day | TRANSDERMAL | Status: DC
  Administered 2019-06-03 – 2019-06-04 (×2): 21 mg via TRANSDERMAL
  Filled 2019-06-03 (×2): qty 1

## 2019-06-03 MED ORDER — ACETYLCYSTEINE LOAD VIA INFUSION
150.0000 mg/kg | Freq: Once | INTRAVENOUS | Status: AC
  Administered 2019-06-03: 11220 mg via INTRAVENOUS
  Filled 2019-06-03: qty 281

## 2019-06-03 MED ORDER — MORPHINE SULFATE (PF) 4 MG/ML IV SOLN
4.0000 mg | Freq: Once | INTRAVENOUS | Status: AC
  Administered 2019-06-03: 4 mg via INTRAVENOUS
  Filled 2019-06-03: qty 1

## 2019-06-03 MED ORDER — PANTOPRAZOLE SODIUM 40 MG IV SOLR
40.0000 mg | INTRAVENOUS | Status: DC
  Administered 2019-06-03 – 2019-06-04 (×2): 40 mg via INTRAVENOUS
  Filled 2019-06-03 (×2): qty 40

## 2019-06-03 MED ORDER — ACTIDOSE WITH SORBITOL 50 GM/240ML PO LIQD
100.0000 g | ORAL | Status: AC
  Administered 2019-06-03: 100 g via ORAL
  Filled 2019-06-03: qty 480

## 2019-06-03 MED ORDER — DEXTROSE 5 % IV SOLN
15.0000 mg/kg/h | INTRAVENOUS | Status: DC
  Administered 2019-06-03 (×2): 15 mg/kg/h via INTRAVENOUS
  Filled 2019-06-03 (×2): qty 120

## 2019-06-03 MED ORDER — LORAZEPAM 0.5 MG PO TABS: 0.5000 mg | ORAL_TABLET | ORAL | Status: DC | PRN

## 2019-06-03 MED ORDER — ACTIDOSE WITH SORBITOL 50 GM/240ML PO LIQD: 100.0000 g | ORAL | Status: AC

## 2019-06-03 MED ORDER — POTASSIUM CHLORIDE CRYS ER 20 MEQ PO TBCR
40.0000 meq | EXTENDED_RELEASE_TABLET | ORAL | Status: AC
  Administered 2019-06-03 – 2019-06-04 (×2): 40 meq via ORAL
  Filled 2019-06-03 (×3): qty 2

## 2019-06-03 MED ORDER — SODIUM CHLORIDE 0.9 % IV SOLN
INTRAVENOUS | Status: DC
  Administered 2019-06-03: 23:00:00 via INTRAVENOUS

## 2019-06-03 MED ORDER — LACTATED RINGERS IV SOLN: INTRAVENOUS | Status: DC

## 2019-06-03 MED ORDER — SODIUM BICARBONATE-DEXTROSE 150-5 MEQ/L-% IV SOLN
150.0000 meq | INTRAVENOUS | Status: DC
  Administered 2019-06-03 (×3): 150 meq via INTRAVENOUS
  Filled 2019-06-03 (×3): qty 1000

## 2019-06-03 MED ORDER — AMOXICILLIN-POT CLAVULANATE 875-125 MG PO TABS
1.0000 | ORAL_TABLET | Freq: Two times a day (BID) | ORAL | Status: DC
  Administered 2019-06-03 – 2019-06-04 (×3): 1 via ORAL
  Filled 2019-06-03 (×3): qty 1

## 2019-06-03 MED ORDER — SODIUM BICARBONATE 8.4 % IV SOLN
INTRAVENOUS | Status: DC
  Administered 2019-06-03: 08:00:00 via INTRAVENOUS
  Filled 2019-06-03 (×2): qty 150

## 2019-06-03 NOTE — ED Triage Notes (Signed)
Pt from home c/o right jaw pain rating pain 10 out of 10. Visable, two teeth on upper right side damaged, no swelling present. Pt states pain is unbearable has not been able to sleep of manage with OTC tylenol/ibuprofen.

## 2019-06-03 NOTE — ED Notes (Signed)
Gave pt male urinal

## 2019-06-03 NOTE — ED Notes (Signed)
Date and time results received: 06/03/19 730  (use smartphrase ".now" to insert current time)  Test: salicylate level Critical Value: 51.1  Name of Provider Notified:Trifan   Orders Received? Or Actions Taken?: .

## 2019-06-03 NOTE — ED Provider Notes (Signed)
.  Critical Care Performed by: Wyvonnia Dusky, MD Authorized by: Wyvonnia Dusky, MD   Critical care provider statement:    Critical care time (minutes):  40   Critical care was necessary to treat or prevent imminent or life-threatening deterioration of the following conditions:  Toxidrome   Critical care was time spent personally by me on the following activities:  Discussions with consultants, evaluation of patient's response to treatment, examination of patient, ordering and performing treatments and interventions, ordering and review of laboratory studies, ordering and review of radiographic studies, pulse oximetry, re-evaluation of patient's condition, obtaining history from patient or surrogate and review of old charts Comments:     Salicylate and acetominophen overdose requiring multiple IV infusions, ECG, urine output monitoring, admission to intermediate ICU   Clinical Course as of Jun 02 953  Sun Jun 03, 2019  0701 Pt signed out to me by Dr. Roxanne Mins.  Briefly 52 yo male presenting with dental pain, reports consuming large amounts of tylenol and ibuprofen yesterday.  Awaiting tylenol level, and will discuss with poison control   [MT]  0746 I spoke to poison control [facility]) who recommends based on salicylate level to administer 20 cc/kg bolus LR, plus starting a drip of sodium bicarbonate (3 amps in D5W with 40 mEQ KCL) at 200 cc/hr for urine output goal 1-2 cc/kg/hour, and recheck labs at the 3 hour mark after initial labs, and to get an ECG.  These are ordered.  I spoke ot pharmacy who asked that K+ be given separately as riders as they have the sodium bicarb prepared separetly.    [MT]  [id number] On my initial assessment patient appears comfortable, complaining only of tooth pain and weakness.  He tells me his last dose of home pain meds was around midnight, and he vomited around 2 am.  There is no confusion, tinnitus, or further vomiting.  His nurse is hanging the fluids now, awaiting  sodium from pharmacy   [MT]  [place] control [facility]) called back and will fax over recommendations for NAC per discussion with her toxicologist. Recommending 24 hour observation.  Given these findings, will admit patient to hospital   [MT]  0830 Photo of teeth in media tab.  Patient with ellis 3 fracture in 2 teeth visible in photo, will start Augmentin BID for pulpitis coverage   [MT]    Clinical Course User Index [MT] Deniz Eskridge, Carola Rhine, MD      Wyvonnia Dusky, MD 06/03/19 [id number]

## 2019-06-03 NOTE — H&P (Addendum)
Date: 06/03/2019               Patient Name:  Dustin Lewis MRN: [id number]  DOB: 1966/02/22 Age / Sex: 53 y.o., male   PCP: Patient, No Pcp Per         Medical Service: Internal Medicine Teaching Service         Attending Physician: Dr. Rogelia Boga, Austin Miles, MD    First Contact: Dr. Ronelle Nigh Pager: [phone]  Second Contact: Dr. Maryla Morrow  Pager: [phone]       After Hours (After 5p/  First Contact Pager: [phone]  weekends / holidays): Second Contact Pager: [phone]   Chief Complaint: Nausea, vomiting, abdominal pain  History of Present Illness: Patient is a 53 year old male with no past medical history who presents with 12-hour history of nausea, vomiting, abdominal pain in the setting of increased acetaminophen and aspirin usage.  Patient reports that he has been having bad tooth pain over the past few days.  Yesterday (Saturday 12/5) he reports taking 20 to 25 tablets of acetaminophen 500 mg as well as the same number of doses of BC powder.  On Thursday and Friday, patient reports taking about 6 doses of each per day.  Patient also reports taking 2 doses of liquid NyQuil last night which he immediately vomited.  Yesterday evening, patient had nausea, vomiting, abdominal pain, diaphoresis, dizziness and subjective fever (she reports self measured temperature of 98.8).  This occurred in the setting of decreased p.o. intake in the past few days due to mouth pain.  Patient denies ringing in ears, recent changes in hearing, chest pain, blood in emesis, stool.  He does state he had blood colored urine 2 to 3 weeks ago.   Patient denies current dental pain, headache.  Meds:  * Acetaminophen * Aspirin-Salicylamide-Caffeine * Nyquil  Allergies: * Denies allergies to medications  Family History: Denies family history of hypertension or diabetes mellitus  Social History:  * Lives at home with fiancee and 41 year old son * Drinks 6 beers/day, last drink 2 days ago, denies history of  withdrawals * Endorses marijuana usage, denies other non-(prescrition/OTC) drug usage * Has not seen a PCP in several years * Works as a Fish farm manager for [employer], moved to [place] 15 years ago, previously lived in [place] among other places.   Review of Systems: A complete ROS was negative except as per HPI.   Physical Exam: Blood pressure (!) 133/94, pulse 89, temperature 97.6 F (36.4 C), temperature source Oral, resp. rate (!) 29, height 6\' 1"  (1.854 m), weight 74.8 kg, SpO2 100 %. Physical Exam  Constitutional: He is well-developed, well-nourished, and in no distress.  HENT:  Head: Normocephalic and atraumatic.  Multiple caries in mouth, poor dentition.  Eyes: EOM are normal. Right eye exhibits no discharge. Left eye exhibits no discharge.  Neck: Normal range of motion. No tracheal deviation present.  Cardiovascular: Normal rate and regular rhythm. Exam reveals no gallop and no friction rub.  No murmur heard. Pulmonary/Chest: Effort normal and breath sounds normal. No respiratory distress. He has no wheezes. He has no rales.  Abdominal: Soft. He exhibits no distension. There is no abdominal tenderness. There is no rebound and no guarding.  Musculoskeletal: Normal range of motion.        General: No tenderness, deformity or edema.  Neurological: He is alert. Coordination normal.  Skin: Skin is warm and dry. No rash noted. He is not diaphoretic. No erythema.  Psychiatric: Memory and judgment  normal.   EKG: personally reviewed my interpretation is no signs of acute infarct, QTC at 430.  CXR: personally reviewed my interpretation is not performed  Assessment & Plan by Problem: Active Problems:   Tylenol overdose, accidental or unintentional, initial encounter   Patient is a 53 year old male with no segment past medical history who presents following a non-intentional overdose of acetaminophen and salicylates with findings suggestive of toxicity.  Poison control consulted,  discussed with ER provider and faxed acetaminophen, salicylate toxicity protocol and plan below is informed by their recommendations  # Acetaminophen Toxicity: Patient with symptomatic toxicity with nausea, vomiting, abdominal pain yesterday.  Time of majority of acetaminophen ingestion spanned yesterday. Acetaminophen level measured at 0603 this a.m. of 45.  Given prolonged usage, patient without precise time of ingestion and qualifies for NAC therapy based on this uncertain time course with history of significant Tylenol ingestion (up to 12.5 grams yesterday per patient).  Patient is currently without signs of liver injury with an AST/ALT of 19/24, INR pending.  Patient with bicarb of 16, normal anion gap. * Activated charcoal administered * NAC therapy initiated per pharmacy consult - 150 mg/kg loading dose over 1 hour followed by 15 mg/kg/hr. Will check serum acetaminophen level, CMP prior to discontinuing therapy and discontinue when acetaminophen level is undetectable and liver, prognostic labs are improving or normal.  # Salicylate Toxicity: Patient with symptomatic toxicity with nausea, vomiting, abdominal pain.  No alteration in mental status.  Patient consuming between 20 and 25 BC powder packets yesterday.  Salicylate level of 11.9.  Patient with bicarb of 16 but no anion gap.  Administered activated charcoal for decontamination and patient started on sodium bicarbonate for urine alkalinization. * Isotonic sodium bicarb in D5 at 200 ml/hr with goal urine output of 150-225 ml/hr (2-3 ml/kg/hr) * Will monitor salicylate, acetaminophen levels, BMP, urine pH  # Chronic alcohol use: Reports drinking six 12 ounce cans of beers daily, last drink 2 days ago. * CIWA protocol * We will check INR * In setting of increased aspirin usage and chronic alcohol usage, will start pantoprazole 40 mg daily to decrease risk of bleeding  # Dental Carries: No abscess, fluctuance.  On Augmentin for likely dental  infection.   Dispo: Admit patient to Observation with expected length of stay less than 2 midnights.  Signed: Jeanmarie Hubert, MD 06/03/2019, 10:21 AM  Pager: [phone]

## 2019-06-03 NOTE — ED Provider Notes (Signed)
[facility] EMERGENCY DEPARTMENT Provider Note   CSN: [id number] Arrival date & time: 06/03/19  2025    History   Chief Complaint Chief Complaint  Patient presents with  . Jaw Pain  . Fatigue  . Nausea    HPI Dustin Lewis is a 53 y.o. male.   The history is provided by the patient.  He complains of pain on the right side of his jaw which has been present for about the last 3-4 days and is getting worse.  Is worse if he has cold items in his mouth but is not affected by jaw movement.  He notes that he has a lot of bad teeth and thinks that that is the source of the pain.  Last night he did break out in sweat and had some nausea and vomiting.  During the course of the day yesterday, he states that he took about 25 BC powders and about 25 acetaminophen tablets to try to get pain relief, but was unable to get adequate relief.  He currently rates his pain at 10/10.  He states that he tries to go to sleep and he wakes up every hour because of the pain.  His last dose of acetaminophen and BC powder was about midnight.  No past medical history on file.  There are no active problems to display for this patient.   No past surgical history on file.      Home Medications    Prior to Admission medications   Medication Sig Start Date End Date Taking? Authorizing Provider  Aspirin-Salicylamide-Caffeine (BC HEADACHE PO) Take 2-3 packets by mouth daily as needed (for pain).    [provider]  doxycycline (VIBRAMYCIN) 100 MG capsule Take 1 capsule (100 mg total) by mouth 2 (two) times daily. 09/03/13   Linwood Dibbles, MD  HYDROcodone-acetaminophen (NORCO/VICODIN) 5-325 MG per tablet Take 1-2 tablets by mouth every 4 (four) hours as needed. 09/03/13   Linwood Dibbles, MD  naproxen (NAPROSYN) 500 MG tablet Take 1 tablet (500 mg total) by mouth 2 (two) times daily. 09/03/13   Linwood Dibbles, MD    Family History No family history on file.  Social History Social History    Tobacco Use  . Smoking status: Current Every Day Smoker  Substance Use Topics  . Alcohol use: Yes  . Drug use: No     Allergies   Patient has no known allergies.   Review of Systems Review of Systems  All other systems reviewed and are negative.    Physical Exam Updated Vital Signs BP (!) 125/93   Pulse 97   Temp 97.6 F (36.4 C) (Oral)   Resp 20   Ht 6\' 1"  (1.854 m)   Wt 74.8 kg   SpO2 100%   BMI 21.77 kg/m   Physical Exam Vitals signs and nursing note reviewed.    53 year old male, resting comfortably and in no acute distress. Vital signs are significant for mildly elevated blood pressure. Oxygen saturation is 100%, which is normal. Head is normocephalic and atraumatic. PERRLA, EOMI. Oropharynx is clear.  Multiple carious teeth but no obvious gingival swelling and no tenderness to percussion any tooth.  No tenderness palpation over the TMJ. Neck is nontender and supple without adenopathy or JVD. Back is nontender and there is no CVA tenderness. Lungs are clear without rales, wheezes, or rhonchi. Chest is nontender. Heart has regular rate and rhythm without murmur. Abdomen is soft, flat, nontender without masses or hepatosplenomegaly and  peristalsis is normoactive. Extremities have no cyanosis or edema, full range of motion is present. Skin is warm and dry without rash. Neurologic: Mental status is normal, cranial nerves are intact, there are no motor or sensory deficits.  ED Treatments / Results  Labs (all labs ordered are listed, but only abnormal results are displayed) Labs Reviewed - No data to display  Procedures Procedures   Medications Ordered in ED Medications - No data to display   Initial Impression / Assessment and Plan / ED Course  I have reviewed the triage vital signs and the nursing notes.  Pertinent lab results that were available during my care of the patient were reviewed by me and considered in my medical decision making (see chart  for details).  Mouth pain most likely related to dental caries but no obvious abscess seen.  I am concerned that he may have taken an accidental overdose of acetaminophen and aspirin.  Will check screening labs including salicylate and acetaminophen levels, and may need to consult with poison control once results are obtained.  7:13 AM CBC shows mild leukocytosis.  Remainder of labs are pending.  Case is signed out to Dr. Kizzie Furnish.  Final Clinical Impressions(s) / ED Diagnoses   Final diagnoses:  Pain, dental    ED Discharge Orders    None       Delora Fuel, MD 16/10/96 [phone]

## 2019-06-04 DIAGNOSIS — E872 Acidosis: Secondary | ICD-10-CM | POA: Diagnosis present

## 2019-06-04 DIAGNOSIS — K029 Dental caries, unspecified: Secondary | ICD-10-CM | POA: Diagnosis present

## 2019-06-04 DIAGNOSIS — T39095A Adverse effect of salicylates, initial encounter: Secondary | ICD-10-CM | POA: Diagnosis present

## 2019-06-04 DIAGNOSIS — T39091A Poisoning by salicylates, accidental (unintentional), initial encounter: Secondary | ICD-10-CM

## 2019-06-04 LAB — CBC
HCT: 39.2 % (ref 39.0–52.0)
Hemoglobin: 13.7 g/dL (ref 13.0–17.0)
MCH: 29.5 pg (ref 26.0–34.0)
MCHC: 34.9 g/dL (ref 30.0–36.0)
MCV: 84.3 fL (ref 80.0–100.0)
Platelets: 226 10*3/uL (ref 150–400)
RBC: 4.65 MIL/uL (ref 4.22–5.81)
RDW: 14 % (ref 11.5–15.5)
WBC: 8.5 10*3/uL (ref 4.0–10.5)
nRBC: 0 % (ref 0.0–0.2)

## 2019-06-04 LAB — COMPREHENSIVE METABOLIC PANEL
ALT: 18 U/L (ref 0–44)
AST: 17 U/L (ref 15–41)
Albumin: 3 g/dL — ABNORMAL LOW (ref 3.5–5.0)
Alkaline Phosphatase: 38 U/L (ref 38–126)
Anion gap: 9 (ref 5–15)
BUN: 5 mg/dL — ABNORMAL LOW (ref 6–20)
CO2: 23 mmol/L (ref 22–32)
Calcium: 7.8 mg/dL — ABNORMAL LOW (ref 8.9–10.3)
Chloride: 106 mmol/L (ref 98–111)
Creatinine, Ser: 0.78 mg/dL (ref 0.61–1.24)
GFR calc Af Amer: >60 mL/min (ref >60)
GFR calc non Af Amer: >60 mL/min (ref >60)
Glucose, Bld: 109 mg/dL — ABNORMAL HIGH (ref 70–99)
Potassium: 3.1 mmol/L — ABNORMAL LOW (ref 3.5–5.1)
Sodium: 138 mmol/L (ref 135–145)
Total Bilirubin: 0.4 mg/dL (ref 0.3–1.2)
Total Protein: 5.1 g/dL — ABNORMAL LOW (ref 6.5–8.1)

## 2019-06-04 LAB — ACETAMINOPHEN LEVEL: Acetaminophen (Tylenol), Serum: <10 ug/mL — ABNORMAL LOW (ref 10–30)

## 2019-06-04 LAB — PROTIME-INR
INR: 1.1 (ref 0.8–1.2)
Prothrombin Time: 14.3 seconds (ref 11.4–15.2)

## 2019-06-04 LAB — SALICYLATE LEVEL: Salicylate Lvl: 10.6 mg/dL (ref 2.8–30.0)

## 2019-06-04 MED ORDER — AMOXICILLIN-POT CLAVULANATE 875-125 MG PO TABS: 1.0000 | ORAL_TABLET | Freq: Two times a day (BID) | ORAL | 0 refills | Status: AC

## 2019-06-04 MED ORDER — OXYCODONE HCL 5 MG PO TABS: 5.0000 mg | ORAL_TABLET | ORAL | 0 refills | Status: AC | PRN

## 2019-06-04 MED ORDER — AMOXICILLIN-POT CLAVULANATE 875-125 MG PO TABS: 1.0000 | ORAL_TABLET | Freq: Two times a day (BID) | ORAL | 0 refills | Status: DC

## 2019-06-04 MED ORDER — POTASSIUM CHLORIDE IN NACL 40-0.9 MEQ/L-% IV SOLN
INTRAVENOUS | Status: DC
  Filled 2019-06-04: qty 1000

## 2019-06-04 MED ORDER — SODIUM CHLORIDE 0.9 % IV SOLN
INTRAVENOUS | Status: DC
  Administered 2019-06-04: 07:00:00 via INTRAVENOUS

## 2019-06-04 MED ORDER — OXYCODONE HCL 5 MG PO TABS
5.0000 mg | ORAL_TABLET | Freq: Once | ORAL | Status: AC
  Administered 2019-06-04: 5 mg via ORAL
  Filled 2019-06-04: qty 1

## 2019-06-04 MED ORDER — POTASSIUM CHLORIDE CRYS ER 20 MEQ PO TBCR
40.0000 meq | EXTENDED_RELEASE_TABLET | Freq: Once | ORAL | Status: AC
  Administered 2019-06-04: 40 meq via ORAL
  Filled 2019-06-04: qty 2

## 2019-06-04 MED ORDER — OXYCODONE HCL 5 MG PO TABS
10.0000 mg | ORAL_TABLET | Freq: Once | ORAL | Status: AC
  Administered 2019-06-04: 10 mg via ORAL
  Filled 2019-06-04: qty 2

## 2019-06-04 MED FILL — AMOX-CLAV 875-125 MG TABLET: 875-125 | 6 days supply | Qty: 12 | Fill #0

## 2019-06-04 MED FILL — oxyCODONE HCL 5 MG TABS: 5 | 5 days supply | Qty: 30 | Fill #0

## 2019-06-04 NOTE — Progress Notes (Signed)
1908 New bag of Acetadote hung at this time @ 28.16ml/hr, noted Sodium Bicarb infusing @ 239ml/hr. Pt resting in bed at this time with visitor at bedside, will cont to monitor, call bell within reach.  1945 Call received from [facility] the pharmacist @  Poison Control requesting update on pt condition- same given. Recommended cont lab draws of BMP,and salicylate levels.Dr Sharon Seller paged at this time to relay message with new orders to follow.  2115 Lab on floor to draw labs and to get a VBG.  2200 Sodium Bicarbonate infusion complete.  2239 Potassium 40 meq po given at this time, next dose @ 0030.  2300 New orders to start NS @ 120ml/hr-same to be done.  0030 Pt refused Potassium saying "I just fell asleep and am not getting up to take those big pills".  0300 Pt up ambulating to bathroom, voided in urinal 473ml, returned back to bed and reconnected to monitors and was asked to take Potassium that he had refused earlier, pt agreed and same was given.  0330 Lab here to collect blood.Will cont to monitor, call bell within reach.

## 2019-06-04 NOTE — Progress Notes (Addendum)
  Subjective:  Patient seen at bedside this morning.  Patient denies nausea, vomiting, abdominal pain.  Patient states he is starting to have more jaw and head pain similar to before.  Patient requesting resources for dentist.   Objective:    Vital Signs (last 24 hours): Vitals:   06/03/19 2000 06/03/19 2357 06/04/19 0000 06/04/19 0334  BP: (!) 144/87 129/87 129/87 (!) 136/98  Pulse: (!) 104 93 85 84  Resp:  19 17 15   Temp:  98.6 F (37 C)  97.7 F (36.5 C)  TempSrc:  Oral  Oral  SpO2:  100% 98% 98%  Weight:      Height:       Physical Exam: General Alert and answers questions appropriately, no acute distress  Cardiac Regular rate and rhythm, no murmurs, rubs, or gallops  Pulmonary Clear to auscultation bilaterally without wheezes, rhonchi, or rales  Abdominal Soft, non-tender, without distention    CMP Latest Ref Rng & Units 06/03/2019 06/03/2019 06/03/2019  Glucose 70 - 99 mg/dL 116(H) 156(H) 130(H)  BUN 6 - 20 mg/dL 8 11 11   Creatinine 0.61 - 1.24 mg/dL 1.12 1.01 1.16  Sodium 135 - 145 mmol/L 138 140 138  Potassium 3.5 - 5.1 mmol/L 2.8(L) 3.7 4.5  Chloride 98 - 111 mmol/L 102 108 107  CO2 22 - 32 mmol/L 25 19(L) 20(L)  Calcium 8.9 - 10.3 mg/dL 8.0(L) 8.0(L) 8.4(L)  Total Protein 6.5 - 8.1 g/dL 5.6(L) 5.6(L) 6.4(L)  Total Bilirubin 0.3 - 1.2 mg/dL 0.6 0.1(L) 0.3  Alkaline Phos 38 - 126 U/L 40 37(L) 49  AST 15 - 41 U/L 19 17 17   ALT 0 - 44 U/L 18 21 21     Assessment/Plan:   Active Problems:   Tylenol overdose, accidental or unintentional, initial encounter  Patient is a 53 year old male with a significant past medical history who presented yesterday following nonintentional overdose of acetaminophen and salicylates with finding suggestions of toxicity.  # Acetaminophen Toxicity: Patient was started on NAC yesterday for symptomatic toxicity at 11am.  Acetaminophen level of 45 at 0603 yesterday.  LFTs and INR remain within normal limits.  Repeat labs this AM revealed  acetaminophen non-detectable, non-acidotic pH on VBG, AST, ALT, bicarb within normal limits. NAC therapy discontinued.  # Salicylate Toxicity: Patient was started on sodium bicarbonate yesterday at 0802 for urine alkalinization.  Initial salicylate level of 96.2.  Repeat level at 2109 yesterday was 29.6 and pH on VBG was nonacidotic at 7.53.  Sodium bicarbonate transfusion was discontinued and patient started on normal saline maintenance fluids at a rate of 125 ml/hr. Repeat salicylate level this AM of 10.6.  # Chronic alcohol use: Patient with history of drinking six 12 ounce cans of beers daily, last drink 2 days prior to presentation. CIWA score of 1 this AM.  * CIWA protocol  # Dental carries: No abscess, fluctuance.  On Augmentin for likely dental infection.  We will continue for total 7-day course.  Outpatient referral placed to dentistry.  Diet: Regular diet DVT Ppx: SCDs Dispo: Plan for discharge today  Jeanmarie Hubert, MD 06/04/2019, 5:59 AM Pager: [phone]

## 2019-06-04 NOTE — Discharge Summary (Addendum)
Name: Dustin Lewis MRN: [id number] DOB: [birth year] 53 y.o. PCP: Patient, No Pcp Per  Date of Admission: 06/03/2019  4:28 AM Date of Discharge: 06/04/2019 Attending Physician: Axel Filler, *  Discharge Diagnosis: 1. Acetaminophen overdose, accidental 2. Salicylate overdose, accidental 3. Pain due to dental caries  Discharge Medications: Allergies as of 06/04/2019   No Known Allergies      Medication List     STOP taking these medications    BC HEADACHE PO   doxycycline 100 MG capsule Commonly known as: VIBRAMYCIN   HYDROcodone-acetaminophen 5-325 MG tablet Commonly known as: NORCO/VICODIN   naproxen 500 MG tablet Commonly known as: NAPROSYN       TAKE these medications    amoxicillin-clavulanate 875-125 MG tablet Commonly known as: AUGMENTIN Take 1 tablet by mouth 2 (two) times daily for 6 days. Notes to patient: 06/04/2019 PM   oxyCODONE 5 MG immediate release tablet Commonly known as: Oxy IR/ROXICODONE Take 1 tablet (5 mg total) by mouth every 4 (four) hours as needed for up to 5 days for severe pain. Notes to patient: Last dose at 06/04/2019 at 1132        Disposition and follow-up:   Mr.Dustin Lewis was discharged from [facility] in Good condition.  At the hospital follow up visit please address:  1.  Please ask patient about over the counter drug usage and ensure patient does not report taking above recommended daily amounts. Please ensure patient has taken course of Augmentin for possible dental infection. Referral to outpatient dentistry was placed for patient at discharge.  2.  Labs / imaging needed at time of follow-up: None  3.  Pending labs/ test needing follow-up: None  Follow-up Appointments: Follow-up Information     [facility] Follow up.   Why: Please call the number above to schedule a followup clinic appointment for approximately 1 week from today Contact information: [address] [facility] Course by problem list: # Acetaminophen Toxicity: Patient presented with history of ingesting up to 12.5 g of acetaminophen over the course of day prior to presentation, with last ingestion approximately 8 hours prior to presentation.  Patient developed nausea, vomiting, abdominal pain and presented to ER.  Acetaminophen level of 45, patient without signs of liver injury with normal transaminases and INR.  Patient had a bicarb of 16 with a normal anion gap.  Activated charcoal was administered and patient was started on NAC therapy.  Repeat labs the next day revealed acetaminophen level nondetectable, nonacidotic pH on VBG and AST, ALT, INR, bicarb within normal limits.  With these results, NAC therapy was discontinued.  # Salicylate Toxicity: Patient presentation consistent with symptomatic toxicity given patient's nausea, vomiting, abdominal pain.  Patient was without alteration in mental status.  Patient consume between 20 and 25 BC powder packets on day prior to presentation.  Salicylate level was 01.7.  Activated charcoal was administered and patient was started on sodium bicarbonate for urine alkalinization.  Repeat level overnight was 29.6 and pH on VBG was nonacidotic, at which time sodium bicarb transfusion was discontinued.  Repeat salicylate levels the next a.m. were 10.6.  [place] poison control was consulted and involved in above toxicity management decisions  # Dental Carries: Patient with poor dentition on exam.  No abscess, fluctuance appreciated.  Patient was started on Augmentin for likely dental infection and discharged to complete  a total 7-day course.  Patient provided with referral to dentist. Patient given 5 day supply of oxycodone for pain. He was advised to avoid acetaminophen and salicylates for a week and then not to exceed maximum recommended doses.  Discharge Vitals:   BP (!) 133/91 (BP  Location: Right Arm)   Pulse 78   Temp 98.1 F (36.7 C) (Oral)   Resp 20   Ht 6\' 1"  (1.854 m)   Wt 74.8 kg   SpO2 100%   BMI 21.77 kg/m   Pertinent Labs, Studies, and Procedures:  CMP Latest Ref Rng & Units 06/04/2019 06/03/2019 06/03/2019  Glucose 70 - 99 mg/dL 161(W109(H) 960(A116(H) 540(J156(H)  BUN 6 - 20 mg/dL 5(L) 8 11  Creatinine 8.110.61 - 1.24 mg/dL 9.140.78 7.821.12 9.561.01  Sodium 135 - 145 mmol/L 138 138 140  Potassium 3.5 - 5.1 mmol/L 3.1(L) 2.8(L) 3.7  Chloride 98 - 111 mmol/L 106 102 108  CO2 22 - 32 mmol/L 23 25 19(L)  Calcium 8.9 - 10.3 mg/dL 7.8(L) 8.0(L) 8.0(L)  Total Protein 6.5 - 8.1 g/dL 5.1(L) 5.6(L) 5.6(L)  Total Bilirubin 0.3 - 1.2 mg/dL 0.4 0.6 2.1(H0.1(L)  Alkaline Phos 38 - 126 U/L 38 40 37(L)  AST 15 - 41 U/L 17 19 17   ALT 0 - 44 U/L 18 18 21     CBC Latest Ref Rng & Units 06/04/2019 06/03/2019 06/03/2019  WBC 4.0 - 10.5 K/uL 8.5 11.3(H) 12.6(H)  Hemoglobin 13.0 - 17.0 g/dL 08.613.7 57.814.0 46.915.7  Hematocrit 39.0 - 52.0 % 39.2 41.1 47.0  Platelets 150 - 400 K/uL 226 242 259   INR: 1.1 -> 1.1  Salicylate Levels: 51.1 -> 46.3 -> 29.6 -> 10.6 Acetaminophen Levels: 45 -> 14 -> less than 10  Discharge Instructions: Discharge Instructions     Ambulatory referral to Dentistry   Complete by: As directed    Call MD for:  difficulty breathing, headache or visual disturbances   Complete by: As directed    Call MD for:  persistant nausea and vomiting   Complete by: As directed    Call MD for:  severe uncontrolled pain   Complete by: As directed    Call MD for:  temperature >100.4   Complete by: As directed    Diet - low sodium heart healthy   Complete by: As directed    Discharge instructions   Complete by: As directed    You were seen in the hospital after taking too much tylenol and BC powder. We want you to avoid taking any of these medications for the next week. For any other over the counter medications that you take, please read the ingredients as many medications such as NyQuil contain  tylenol (acetminophen) as well as other ingredients which can be dangerous at high doses. We have provided you with oxycodone which you can take over the next week for any pain you havel  When you do take tylenol, please do not take more than 3,000 mg in a day. For the 500 mg tablets that you were taking, this means that you should not take more than 6 tablets in a day. For the [facility] powder, please read the box and do not take more than the maximum recommended amount on the box.  We have also provided you with Bactrim which is an antibiotic. We want you to take this for the next 6 days to treat any infection in your mouth.  We have sent a referral to a dental clinic and they  should call you for an appointment  Thank you for allowing [place] to be part of your medical care!   Increase activity slowly   Complete by: As directed        Signed: Katherine Roan, MD 06/04/2019, 2:19 PM   Pager: [phone]

## 2019-06-04 NOTE — TOC Initial Note (Signed)
Transition of Care [facility]) - Initial/Assessment Note    Patient Details  Name: Dustin Lewis MRN: [id number] Date of Birth: 1966/03/10  [facility] CM/SW Contact:    Benard Halsted, LCSW Phone Number: 06/04/2019, 2:36 PM  Clinical Narrative:                 CSW spoke with patient regarding ETOH/marijuana use. He reported that he does them both recreationally and does not have a problem with cessation. He politely declined resources. He did state that he has been searching for a primary care doctor. Internal Medicine has instructed the patient to contact them for an appointment. No other needs identified at this time.   Expected Discharge Plan: Home/Self Care Barriers to Discharge: No Barriers Identified   Patient Goals and CMS Choice Patient states their goals for this hospitalization and ongoing recovery are:: Feel better      Expected Discharge Plan and Services Expected Discharge Plan: Home/Self Care In-house Referral: Clinical Social Work, PCP / Curator Services: [facility] Choice: NA Living arrangements for the past 2 months: Single Family Home Expected Discharge Date: 06/04/19               DME Arranged: N/A         HH Arranged: NA          Prior Living Arrangements/Services Living arrangements for the past 2 months: Single Family Home Lives with:: Self Patient language and need for interpreter reviewed:: Yes Do you feel safe going back to the place where you live?: Yes      Need for Family Participation in Patient Care: No (Comment) Care giver support system in place?: No (comment)   Criminal Activity/Legal Involvement Pertinent to Current Situation/Hospitalization: No - Comment as needed  Activities of Daily Living      Permission Sought/Granted                  Emotional Assessment Appearance:: Appears stated age Attitude/Demeanor/Rapport: Engaged Affect (typically observed):  Accepting, Appropriate Orientation: : Oriented to Self, Oriented to Place, Oriented to  Time, Oriented to Situation Alcohol / Substance Use: Alcohol Use Psych Involvement: No (comment)  Admission diagnosis:  Pain, dental [K08.89] Accidental acetaminophen overdose, initial encounter [T39.1X1A] Open fracture of tooth, initial encounter [S02.5XXB] Salicylate overdose, accidental or unintentional, initial encounter [T39.091A] Patient Active Problem List   Diagnosis Date Noted  . Overdose of salicylate 66/44/0347  . Metabolic acidosis due to salicylate 42/59/5638  . Pain due to dental caries 06/04/2019  . Tylenol overdose, accidental or unintentional, initial encounter 06/03/2019   PCP:  Patient, No Pcp Per Pharmacy:   [facility] [place] [place] [facility] [place] [address] [place] [address] Phone: [phone] Fax: [phone]  [id number] [place], [place] [address] [place] [address] Phone: [phone] Fax: [phone]  [facility] [place], [place] [address] [place] [address] Phone: [phone] Fax: [phone]     Social Determinants of Health (SDOH) Interventions    Readmission Risk Interventions No flowsheet data found.

## 2019-06-13 ENCOUNTER — Ambulatory Visit: Payer: Self-pay
